# Patient Record
Sex: Male | Born: 1990 | Race: White | Hispanic: No | Marital: Married | Smoking: Former smoker
Health system: Southern US, Community
[De-identification: ages and names within clinical notes are randomized; demographics above are authoritative.]

## PROBLEM LIST (undated history)

## (undated) DIAGNOSIS — T23209A Burn of second degree of unspecified hand, unspecified site, initial encounter: Secondary | ICD-10-CM

## (undated) DIAGNOSIS — G709 Myoneural disorder, unspecified: Secondary | ICD-10-CM

## (undated) DIAGNOSIS — Z8669 Personal history of other diseases of the nervous system and sense organs: Secondary | ICD-10-CM

## (undated) DIAGNOSIS — J45909 Unspecified asthma, uncomplicated: Secondary | ICD-10-CM

## (undated) DIAGNOSIS — R002 Palpitations: Secondary | ICD-10-CM

## (undated) DIAGNOSIS — K219 Gastro-esophageal reflux disease without esophagitis: Secondary | ICD-10-CM

## (undated) DIAGNOSIS — R5383 Other fatigue: Secondary | ICD-10-CM

## (undated) HISTORY — DX: Gastro-esophageal reflux disease without esophagitis: K21.9

## (undated) HISTORY — DX: Palpitations: R00.2

## (undated) HISTORY — DX: Myoneural disorder, unspecified: G70.9

## (undated) HISTORY — DX: Personal history of other diseases of the nervous system and sense organs: Z86.69

## (undated) HISTORY — DX: Other fatigue: R53.83

## (undated) HISTORY — DX: Burn of second degree of unspecified hand, unspecified site, initial encounter: T23.209A

## (undated) HISTORY — DX: Unspecified asthma, uncomplicated: J45.909

---
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
## 2010-08-29 DIAGNOSIS — R5383 Other fatigue: Secondary | ICD-10-CM

## 2010-08-29 DIAGNOSIS — T23209A Burn of second degree of unspecified hand, unspecified site, initial encounter: Secondary | ICD-10-CM

## 2010-08-29 HISTORY — DX: Burn of second degree of unspecified hand, unspecified site, initial encounter: T23.209A

## 2010-08-29 HISTORY — DX: Other fatigue: R53.83

## 2010-09-12 ENCOUNTER — Emergency Department (HOSPITAL_COMMUNITY)
Admission: EM | Admit: 2010-09-12 | Discharge: 2010-09-12 | Payer: Self-pay | Source: Home / Self Care | Admitting: Family Medicine

## 2010-10-18 ENCOUNTER — Encounter: Payer: Self-pay | Admitting: Family Medicine

## 2010-10-18 ENCOUNTER — Other Ambulatory Visit: Payer: Self-pay | Admitting: Family Medicine

## 2010-10-18 ENCOUNTER — Ambulatory Visit (INDEPENDENT_AMBULATORY_CARE_PROVIDER_SITE_OTHER): Payer: 59 | Admitting: Family Medicine

## 2010-10-18 DIAGNOSIS — Z0000 Encounter for general adult medical examination without abnormal findings: Secondary | ICD-10-CM

## 2010-10-18 LAB — LIPID PANEL
Cholesterol: 152 mg/dL (ref 0–200)
HDL: 53.3 mg/dL (ref >39.00)
LDL Cholesterol: 85 mg/dL (ref 0–99)
Triglycerides: 70 mg/dL (ref 0.0–149.0)
VLDL: 14 mg/dL (ref 0.0–40.0)

## 2010-10-18 LAB — BASIC METABOLIC PANEL
CO2: 29 mEq/L (ref 19–32)
Calcium: 9.1 mg/dL (ref 8.4–10.5)
Chloride: 105 mEq/L (ref 96–112)
Creatinine, Ser: 1.1 mg/dL (ref 0.4–1.5)
GFR: 89.69 mL/min (ref >60.00)
Glucose, Bld: 90 mg/dL (ref 70–99)
Potassium: 4.5 mEq/L (ref 3.5–5.1)
Sodium: 139 mEq/L (ref 135–145)
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
## 2010-12-15 ENCOUNTER — Encounter: Payer: Self-pay | Admitting: Family Medicine

## 2010-12-17 ENCOUNTER — Ambulatory Visit (INDEPENDENT_AMBULATORY_CARE_PROVIDER_SITE_OTHER): Payer: 59 | Admitting: Family Medicine

## 2010-12-17 ENCOUNTER — Encounter: Payer: Self-pay | Admitting: Family Medicine

## 2010-12-17 VITALS — BP 130/90 | HR 80 | Temp 98.4°F | Ht 66.0 in | Wt 156.1 lb

## 2010-12-17 DIAGNOSIS — R5383 Other fatigue: Secondary | ICD-10-CM | POA: Insufficient documentation

## 2010-12-17 DIAGNOSIS — J029 Acute pharyngitis, unspecified: Secondary | ICD-10-CM

## 2010-12-17 LAB — MONONUCLEOSIS SCREEN: Mono Screen: NEGATIVE

## 2010-12-17 NOTE — Patient Instructions (Signed)
This could be mono given sore throat and fatigue and muscle aches or a viral infection with some residual fatigue. Push fluids and plenty of rest. Out of contact sports for next few weeks, until start feeling better. Call us with questions. Return if not improving as expected or worsening.

## 2010-12-17 NOTE — Progress Notes (Signed)
  Subjective:    Patient ID: Derek Thornton, male    DOB: 1990-11-08, 20 y.o.   MRN: [id number]  HPI CC: fatigue  2wk h/o progressive fatigue.  ST x 2 days last week.  + cough, mild.  + myalgias.  Feeling raw in groin crease but no rash (after drill jiu jitsu).  No fevers/chills, HA, sinus pain, abd pain, n/v/d, chest pain, SOB.  No new rashes.  Brother feels sick as well.  Lives in [place].  No new medicines, supplements, vitamins, herbs.  No new sexual contacts.   Did have similar episode 08/2010 of ST, LAD.  RST neg at [facility], told viral.  Review of Systems Per HPI    Objective:   Physical Exam  Nursing note and vitals reviewed. Constitutional: He is oriented to person, place, and time. He appears well-developed and well-nourished. No distress.  HENT:  Head: Normocephalic and atraumatic.  Right Ear: External ear normal.  Left Ear: External ear normal.  Nose: Nose normal.  Mouth/Throat: Uvula is midline and mucous membranes are normal. Posterior oropharyngeal erythema (+ PND) present. No oropharyngeal exudate, posterior oropharyngeal edema or tonsillar abscesses.  Eyes: Conjunctivae and EOM are normal. Pupils are equal, round, and reactive to light. No scleral icterus.  Neck: Normal range of motion. Neck supple. No thyromegaly present.  Cardiovascular: Normal rate, regular rhythm, normal heart sounds and intact distal pulses.   No murmur heard. Pulmonary/Chest: Effort normal and breath sounds normal. No respiratory distress. He has no wheezes. He has no rales.  Abdominal: Soft. Bowel sounds are normal. He exhibits no distension and no mass. There is splenomegaly (mild). There is no tenderness. There is no guarding.  Musculoskeletal: Normal range of motion.  Lymphadenopathy:    He has cervical adenopathy (mild nontender RAC LAD).  Neurological: He is alert and oriented to person, place, and time.  Skin: Skin is warm and dry. No rash (no rash in groin crease) noted.    Psychiatric: He has a normal mood and affect.          Assessment & Plan:

## 2010-12-17 NOTE — Assessment & Plan Note (Signed)
In setting of recent ST, myalgias.  Rapid strep neg.  ? Mono given age.  Hasn't had in past. Check monospot. If neg, ?viral illness.  Supportive care and f/u if not improving. Mild splenomegaly on exam.  Discussed out of contact sports for next 2-3 wks and until feeling better.

## 2011-02-20 ENCOUNTER — Inpatient Hospital Stay (INDEPENDENT_AMBULATORY_CARE_PROVIDER_SITE_OTHER)
Admission: RE | Admit: 2011-02-20 | Discharge: 2011-02-20 | Disposition: A | Discharge status: Home / Self Care | Payer: 59 | Source: Ambulatory Visit | Attending: Family Medicine | Admitting: Family Medicine

## 2011-02-20 DIAGNOSIS — T23039A Burn of unspecified degree of unspecified multiple fingers (nail), not including thumb, initial encounter: Secondary | ICD-10-CM

## 2011-02-22 ENCOUNTER — Inpatient Hospital Stay (INDEPENDENT_AMBULATORY_CARE_PROVIDER_SITE_OTHER)
Admission: RE | Admit: 2011-02-22 | Discharge: 2011-02-22 | Disposition: A | Discharge status: Home / Self Care | Payer: 59 | Source: Ambulatory Visit | Attending: Emergency Medicine | Admitting: Emergency Medicine

## 2011-02-22 DIAGNOSIS — T23069A Burn of unspecified degree of back of unspecified hand, initial encounter: Secondary | ICD-10-CM

## 2011-02-25 ENCOUNTER — Encounter: Payer: Self-pay | Admitting: Family Medicine

## 2011-02-25 ENCOUNTER — Ambulatory Visit (INDEPENDENT_AMBULATORY_CARE_PROVIDER_SITE_OTHER): Payer: 59 | Admitting: Family Medicine

## 2011-02-25 VITALS — BP 110/72 | HR 76 | Temp 99.3°F | Ht 66.0 in | Wt 165.8 lb

## 2011-02-25 DIAGNOSIS — T23209A Burn of second degree of unspecified hand, unspecified site, initial encounter: Secondary | ICD-10-CM

## 2011-02-25 NOTE — Patient Instructions (Signed)
Keep hand dry and clean. Return Monday at 8:15am for recheck and likely redressing (ok to double book). Watch out for fevers, increasing pain.
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
## 2011-02-26 ENCOUNTER — Encounter: Payer: Self-pay | Admitting: Family Medicine

## 2011-02-28 ENCOUNTER — Encounter: Payer: Self-pay | Admitting: Family Medicine

## 2011-02-28 ENCOUNTER — Ambulatory Visit (INDEPENDENT_AMBULATORY_CARE_PROVIDER_SITE_OTHER): Payer: 59 | Admitting: Family Medicine

## 2011-02-28 VITALS — BP 118/80 | HR 68 | Temp 98.1°F | Wt 166.1 lb

## 2011-02-28 DIAGNOSIS — T23209A Burn of second degree of unspecified hand, unspecified site, initial encounter: Secondary | ICD-10-CM

## 2011-02-28 NOTE — Patient Instructions (Signed)
Keep hand dry and clean. Return Friday morning for recheck and redressing. No charge. Watch out for fevers, increasing pain.  If that happens, let us know.

## 2011-02-28 NOTE — Assessment & Plan Note (Signed)
No evidence of super infection. Cleaned, irrigated, removed old silvadene.   Redressed - placed silvadene, then nonadherent gauze, then regular gauze and wrapped first 3 fingers with kerlex. DOI: 02/20/2011.  Blister slowly resorbing. RTC Friday for redress.
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
## 2011-03-01 ENCOUNTER — Encounter: Payer: Self-pay | Admitting: Family Medicine

## 2011-03-01 ENCOUNTER — Ambulatory Visit (INDEPENDENT_AMBULATORY_CARE_PROVIDER_SITE_OTHER): Payer: 59 | Admitting: Family Medicine

## 2011-03-01 VITALS — BP 124/76 | HR 68 | Temp 98.1°F

## 2011-03-01 DIAGNOSIS — T23209A Burn of second degree of unspecified hand, unspecified site, initial encounter: Secondary | ICD-10-CM

## 2011-03-01 NOTE — Assessment & Plan Note (Signed)
With ruptured blister. Removed blister, irrigated, dressed with SSD. Return Thursday for redressing.  Likely will need daily dressing changes. If any scarring or eschar, referral to wound care. DOI: 02/20/2011
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
## 2011-03-01 NOTE — Patient Instructions (Signed)
Keep dressing clean and dry. Return Thursday for follow up.

## 2011-03-03 ENCOUNTER — Encounter: Payer: Self-pay | Admitting: Family Medicine

## 2011-03-03 ENCOUNTER — Ambulatory Visit (INDEPENDENT_AMBULATORY_CARE_PROVIDER_SITE_OTHER): Payer: 59 | Admitting: Family Medicine

## 2011-03-03 VITALS — BP 138/80 | HR 72 | Temp 98.3°F

## 2011-03-03 DIAGNOSIS — T23209A Burn of second degree of unspecified hand, unspecified site, initial encounter: Secondary | ICD-10-CM

## 2011-03-03 NOTE — Patient Instructions (Signed)
Return tomorrow for f/u and re dressing.

## 2011-03-03 NOTE — Assessment & Plan Note (Signed)
Some granulation tissue. No evidence of infection. Redressed today. Return tomorrow for f/u, will discuss dressing change then at home. Update if questions.
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
## 2011-03-04 ENCOUNTER — Ambulatory Visit: Payer: 59 | Admitting: Family Medicine

## 2011-03-04 ENCOUNTER — Ambulatory Visit (INDEPENDENT_AMBULATORY_CARE_PROVIDER_SITE_OTHER): Payer: 59 | Admitting: Family Medicine

## 2011-03-04 ENCOUNTER — Encounter: Payer: Self-pay | Admitting: Family Medicine

## 2011-03-04 DIAGNOSIS — T23209A Burn of second degree of unspecified hand, unspecified site, initial encounter: Secondary | ICD-10-CM

## 2011-03-04 MED ORDER — SILVER SULFADIAZINE 1 % EX CREA: TOPICAL_CREAM | Freq: Every day | CUTANEOUS | Status: DC
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
## 2011-03-04 NOTE — Assessment & Plan Note (Addendum)
Increase in granulation tissue. No evidence of infection. Redressed today. Discussed case with Dr. Para March, he saw wound. Discussed how to dress over weekend, return on Tuesday for f/u with Dr D as I will be gone that week. Sent in SSD script to pharmacy. Update if questions.

## 2011-03-04 NOTE — Patient Instructions (Signed)
Good to see you today. Silver sulfadene cream sent to pharmacy. Start changing dressing once daily at home. Remove old dressing, then wash with normal saline solution or sterile water, then place white cream over burn, then cover with non-adherent gauze, then regular gauze, then kerlex to wrap hand.  Wrap fingers individually. You can get materials at medical supply store, possibly at local pharmacy. Return on Tuesday with Dr. Para March for recheck and dressing change.

## 2011-03-08 ENCOUNTER — Ambulatory Visit (INDEPENDENT_AMBULATORY_CARE_PROVIDER_SITE_OTHER): Payer: 59 | Admitting: Family Medicine

## 2011-03-08 ENCOUNTER — Encounter: Payer: Self-pay | Admitting: Family Medicine

## 2011-03-08 DIAGNOSIS — R5383 Other fatigue: Secondary | ICD-10-CM

## 2011-03-08 DIAGNOSIS — R5381 Other malaise: Secondary | ICD-10-CM

## 2011-03-08 DIAGNOSIS — T23209A Burn of second degree of unspecified hand, unspecified site, initial encounter: Secondary | ICD-10-CM

## 2011-03-08 NOTE — Patient Instructions (Signed)
I want to see your hand again on Friday.  Keep going with the dressing changes.  Take care.   No charge on visit.

## 2011-03-08 NOTE — Assessment & Plan Note (Signed)
Continue topical tx with gentle rom exercise at the time of dressing change.  Recheck Friday.

## 2011-03-08 NOTE — Progress Notes (Signed)
F/u for burn.  Feeling well except for fatigue and occ ST- he had talked to Dr. Reece Agar about this prev.  He had been compliant with dressing changes.    Meds, vitals, and allergies reviewed.   ROS: See HPI.  Otherwise, noncontributory.  nad OP with mild cobblestoning but no exudates L hand with resolving burn.  The area is pink with granulation tissue but no sign of infection/pus.  Slight dec in rom for flexion of the L 2nd digit.  Distally nv intact

## 2011-03-08 NOTE — Assessment & Plan Note (Signed)
Nontoxic, I will defer to PMD.

## 2011-03-11 ENCOUNTER — Encounter: Payer: Self-pay | Admitting: Family Medicine

## 2011-03-11 ENCOUNTER — Ambulatory Visit (INDEPENDENT_AMBULATORY_CARE_PROVIDER_SITE_OTHER): Payer: 59 | Admitting: Family Medicine

## 2011-03-11 DIAGNOSIS — T23209A Burn of second degree of unspecified hand, unspecified site, initial encounter: Secondary | ICD-10-CM

## 2011-03-11 NOTE — Assessment & Plan Note (Signed)
Healing, continue bandage as prev and f/u with PMD next week, call in sooner/eval sooner prn.  He agrees.

## 2011-03-11 NOTE — Progress Notes (Signed)
ROM improved and feeling well.  Has had hand bandaged w/o complication.  Meds, vitals, and allergies reviewed.   ROS: See HPI.  Otherwise, noncontributory.  nad L hand with pink healthy appearing tissue.  Distally NV intact

## 2011-03-11 NOTE — Patient Instructions (Signed)
Keep your hand bandaged over the weekend and see Dr. Reece Agar next week (Wednesday).  Take care.

## 2011-03-14 ENCOUNTER — Ambulatory Visit: Payer: 59 | Admitting: Family Medicine

## 2011-03-15 ENCOUNTER — Ambulatory Visit: Payer: 59 | Admitting: Family Medicine

## 2011-03-16 ENCOUNTER — Ambulatory Visit (INDEPENDENT_AMBULATORY_CARE_PROVIDER_SITE_OTHER): Payer: 59 | Admitting: Family Medicine

## 2011-03-16 ENCOUNTER — Encounter: Payer: Self-pay | Admitting: Family Medicine

## 2011-03-16 VITALS — BP 116/70 | HR 72 | Temp 98.3°F

## 2011-03-16 DIAGNOSIS — T23209A Burn of second degree of unspecified hand, unspecified site, initial encounter: Secondary | ICD-10-CM

## 2011-03-16 NOTE — Progress Notes (Signed)
  Subjective:    Patient ID: Derek Thornton, male    DOB: Nov 26, 1990, 20 y.o.   MRN: [id number]  HPI CC: f/u partial thickness burn.  Doing well.  No fevers/chills, no draining or redness or pus.  No pain.  Dressing daily with silver sulfadiazene, father helps at times.  Notes improvement in range of motion.  Review of Systems Per HPI    Objective:   Physical Exam  Nursing note and vitals reviewed. NAD L hand with pink, healthy tissue.  NV intact. Some dead skin distal indexfinger past PIP, removed with forceps. Soaked hand in normal saline.        Assessment & Plan:
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
## 2011-03-16 NOTE — Patient Instructions (Signed)
Hand's looking good. Continue dressing changes as up to now. Return if fever, spreading redness or any concerns. Do some hand grips with ball each day during dressing changes to help with range of motion.

## 2011-03-24 ENCOUNTER — Ambulatory Visit (INDEPENDENT_AMBULATORY_CARE_PROVIDER_SITE_OTHER): Payer: 59 | Admitting: Family Medicine

## 2011-03-24 ENCOUNTER — Encounter: Payer: Self-pay | Admitting: Family Medicine

## 2011-03-24 VITALS — BP 122/78 | HR 80 | Temp 98.6°F | Wt 165.0 lb

## 2011-03-24 DIAGNOSIS — J029 Acute pharyngitis, unspecified: Secondary | ICD-10-CM

## 2011-03-24 DIAGNOSIS — W57XXXA Bitten or stung by nonvenomous insect and other nonvenomous arthropods, initial encounter: Secondary | ICD-10-CM

## 2011-03-24 DIAGNOSIS — R5383 Other fatigue: Secondary | ICD-10-CM

## 2011-03-24 DIAGNOSIS — T23209A Burn of second degree of unspecified hand, unspecified site, initial encounter: Secondary | ICD-10-CM

## 2011-03-24 DIAGNOSIS — T148XXA Other injury of unspecified body region, initial encounter: Secondary | ICD-10-CM

## 2011-03-24 DIAGNOSIS — T148 Other injury of unspecified body region: Secondary | ICD-10-CM

## 2011-03-24 LAB — COMPREHENSIVE METABOLIC PANEL
ALT: 32 U/L (ref 0–53)
AST: 25 U/L (ref 0–37)
Albumin: 4.7 g/dL (ref 3.5–5.2)
Alkaline Phosphatase: 76 U/L (ref 39–117)
BUN: 17 mg/dL (ref 6–23)
CO2: 31 mEq/L (ref 19–32)
Calcium: 9.4 mg/dL (ref 8.4–10.5)
Chloride: 101 mEq/L (ref 96–112)
GFR: 91.24 mL/min (ref >60.00)
Glucose, Bld: 100 mg/dL — ABNORMAL HIGH (ref 70–99)
Potassium: 3.8 mEq/L (ref 3.5–5.1)
Sodium: 139 mEq/L (ref 135–145)
Total Bilirubin: 0.7 mg/dL (ref 0.3–1.2)
Total Protein: 7.7 g/dL (ref 6.0–8.3)

## 2011-03-24 LAB — CBC WITH DIFFERENTIAL/PLATELET
Basophils Absolute: 0 10*3/uL (ref 0.0–0.1)
Basophils Relative: 0.3 % (ref 0.0–3.0)
Eosinophils Absolute: 0.1 10*3/uL (ref 0.0–0.7)
Hemoglobin: 14.8 g/dL (ref 13.0–17.0)
Lymphocytes Relative: 29.2 % (ref 12.0–46.0)
MCHC: 34.4 g/dL (ref 30.0–36.0)
Monocytes Relative: 6 % (ref 3.0–12.0)
Neutro Abs: 3.2 10*3/uL (ref 1.4–7.7)
Neutrophils Relative %: 62.3 % (ref 43.0–77.0)
RBC: 4.8 Mil/uL (ref 4.22–5.81)

## 2011-03-24 LAB — TSH: TSH: 2.28 u[IU]/mL (ref 0.35–5.50)

## 2011-03-24 NOTE — Assessment & Plan Note (Signed)
rec switch from silvadene to vaseline. Continues healing well, however somewhat dry skin. Continue daily dressing changes.

## 2011-03-24 NOTE — Assessment & Plan Note (Addendum)
Unclear etiology, going on for 6 mo now. Check for reversible causes with blood work. Denies illicit substances, not high risk sexual illness, check hep panel. Consider checking testosterone, although denies depressed mood. Discussed healthy eating as well as sleep habits, seem healthy. Check lyme titers as recent tick bite.

## 2011-03-24 NOTE — Assessment & Plan Note (Signed)
1/4 centor criteria for strep, did not check RST. No LAD on exam today. ? Return of viral illness.

## 2011-03-24 NOTE — Patient Instructions (Addendum)
May stop silvadene, use vaseline ointment with dressing changes. Will check blood work today.   Continue to push water, continue multivitamin. We will notify you with results

## 2011-03-24 NOTE — Progress Notes (Signed)
  Subjective:    Patient ID: Derek Thornton, male    DOB: [birth year], 20 y.o.   MRN: [id number]  HPI CC: fatigue, lump in throat  Presents with mom.  Wt Readings from Last 3 Encounters:  03/24/11 165 lb (74.844 kg)  02/28/11 166 lb 1.9 oz (75.352 kg)  02/25/11 165 lb 12 oz (75.184 kg)  weight gain from 155 to 165lbs in 6 mo.  Fatigue continued.  Now feeling like having fog in brain, spaciness for last several weeks. Mom states he has been complaining of these sxs more for the last month.  Sometimes feels lightheaded when working on computer.  Has been unable to work out like he previously could 2/2 fatigue.  Trouble focusing.  Denies depressed mood, no anhedonia.  This week - noticed swelling in neck on left side.  Noticing sore throat as well, worse with talking for prolonged periods of time.    No fevers, chills, abd pain, n/v, diarrhea, constipation.  Sleeping well at night, averages 8 hours, light sleeper, wakes up easily 3-4 x throughout night.  No snoring, no PNDyspnea.  No cough, abd pain, jaundice, no international exposure.  No night sweats.  Denies smoking, no drinking, no rec drugs.  Caffeine - 3 soda drinks/day, no coffee or tea.  Staying well hydrated.  Eating ok.  Was bitten by tick 4 mo ago, never fever or rash.  Mother is Hep C carrier.  States had STD check late 2011 with negative CT/GC, HIV, RPR, no new partners.  Review of Systems Per HPI    Objective:   Physical Exam  Nursing note and vitals reviewed. Constitutional: He appears well-developed and well-nourished. No distress.  HENT:  Head: Normocephalic and atraumatic.  Right Ear: Hearing, tympanic membrane, external ear and ear canal normal.  Left Ear: Hearing, tympanic membrane, external ear and ear canal normal.  Nose: Nose normal. No mucosal edema or rhinorrhea.  Mouth/Throat: Uvula is midline, oropharynx is clear and moist and mucous membranes are normal. No oropharyngeal exudate, posterior oropharyngeal  edema or posterior oropharyngeal erythema.  Eyes: Conjunctivae and EOM are normal. Pupils are equal, round, and reactive to light. No scleral icterus.  Neck: Normal range of motion. Neck supple. No thyromegaly present.  Cardiovascular: Normal rate, regular rhythm, normal heart sounds and intact distal pulses.   No murmur heard. Pulmonary/Chest: Effort normal and breath sounds normal. No respiratory distress. He has no wheezes. He has no rales.  Abdominal: Soft. Bowel sounds are normal. He exhibits no distension. There is no tenderness. There is no rebound.  Musculoskeletal: Normal range of motion. He exhibits no edema.  Lymphadenopathy:       Head (right side): No submental, no submandibular, no tonsillar, no preauricular, no posterior auricular and no occipital adenopathy present.       Head (left side): No submental, no submandibular, no tonsillar, no preauricular, no posterior auricular and no occipital adenopathy present.    He has no cervical adenopathy.    He has no axillary adenopathy.       Right: No supraclavicular adenopathy present.       Left: No supraclavicular adenopathy present.  Skin: Skin is warm and dry. No rash noted.  Psychiatric: He has a normal mood and affect.          Assessment & Plan:

## 2011-03-25 LAB — HEPATITIS PANEL, ACUTE
Hep A IgM: NEGATIVE
Hep B C IgM: NEGATIVE
Hepatitis B Surface Ag: NEGATIVE

## 2011-03-25 LAB — B. BURGDORFI ANTIBODIES: B burgdorferi Ab IgG+IgM: 0.16 {ISR}

## 2011-03-28 ENCOUNTER — Telehealth: Payer: Self-pay | Admitting: Family Medicine

## 2011-03-28 DIAGNOSIS — R5383 Other fatigue: Secondary | ICD-10-CM

## 2011-03-28 NOTE — Telephone Encounter (Signed)
Please notify all blood work returned normal. Hepatitis panel (A B and C), lyme disease titers negative, kidneys, liver, sugar, electrolytes, blood count, thyroid, vitamin B12,   Nothing to explain fatigue. Questions for him:  1 any memory loss?  2 any family history of low testosterone?  If so, may recommend checking testosterone level.   Otherwise recommend slowly increasing activity as tolerated - starting with exercise as that will release endorphins and hopefully pick up his energy level.

## 2011-03-28 NOTE — Telephone Encounter (Signed)
Spoke with patient. He said he doesn't really have memory loss but says his brain feels "foggy" like he has to think about things really hard to remember them.   He says he thinks one of his cousins has low testosterone but none of his immediate family members.   He is aware you are out of the office, so I told him I would give him a call back with your thoughts when I hear back from you.

## 2011-03-28 NOTE — Telephone Encounter (Signed)
Message left for patient to return my call.  

## 2011-03-30 NOTE — Telephone Encounter (Signed)
Message left notifying patient to schedule lab appt for testosterone.

## 2011-03-30 NOTE — Telephone Encounter (Signed)
Could check testosterone level.  Have him come back for this test at his convenience in am. If normal, recommend slowly increasing activity as tolerated to increase energy level.

## 2011-04-04 ENCOUNTER — Other Ambulatory Visit (INDEPENDENT_AMBULATORY_CARE_PROVIDER_SITE_OTHER): Payer: 59 | Admitting: Family Medicine

## 2011-04-04 DIAGNOSIS — R5383 Other fatigue: Secondary | ICD-10-CM

## 2011-04-18 ENCOUNTER — Telehealth: Payer: Self-pay | Admitting: *Deleted

## 2011-04-18 DIAGNOSIS — R5383 Other fatigue: Secondary | ICD-10-CM

## 2011-04-18 NOTE — Telephone Encounter (Signed)
Pt's mother calls requesting a referral to [facility] Neuro re: pt's "confusion, problems focusing, fogginess" she states  this has been going on for several months and it has been discussed with you. I advised mother we may need to see him again, and that neuro referrals can take a while and often require a lot of documentation. She asked I run it by you before making an appt.

## 2011-04-19 NOTE — Telephone Encounter (Signed)
Ok to refer to neurology. Longstanding fatigue, now with mental fogginess for further eval. Placed order in chart.

## 2011-04-20 NOTE — Telephone Encounter (Signed)
Spoke with patient. He does want the neuro referral. I advised him that Shirlee Limerick would be calling him to get something scheduled. He verbalized understanding.

## 2011-04-20 NOTE — Telephone Encounter (Addendum)
Please check with son to see if he still wants this done and then notify we will be contacting him for referral. Would likely recommend Dr. Sandria Manly if able to get in with him.

## 2011-04-21 NOTE — Telephone Encounter (Signed)
Neurolgy appt made with Dr Denton Meek on 05/06/2011. MK

## 2011-04-28 ENCOUNTER — Encounter: Payer: Self-pay | Admitting: Family Medicine

## 2011-04-28 ENCOUNTER — Ambulatory Visit (INDEPENDENT_AMBULATORY_CARE_PROVIDER_SITE_OTHER): Payer: 59 | Admitting: Family Medicine

## 2011-04-28 VITALS — BP 122/72 | HR 84 | Temp 98.2°F | Wt 169.8 lb

## 2011-04-28 DIAGNOSIS — R5381 Other malaise: Secondary | ICD-10-CM

## 2011-04-28 DIAGNOSIS — W57XXXA Bitten or stung by nonvenomous insect and other nonvenomous arthropods, initial encounter: Secondary | ICD-10-CM | POA: Insufficient documentation

## 2011-04-28 DIAGNOSIS — R5383 Other fatigue: Secondary | ICD-10-CM

## 2011-04-28 DIAGNOSIS — T23209A Burn of second degree of unspecified hand, unspecified site, initial encounter: Secondary | ICD-10-CM

## 2011-04-28 MED ORDER — DOXYCYCLINE HYCLATE 100 MG PO CAPS: 100.0000 mg | ORAL_CAPSULE | Freq: Two times a day (BID) | ORAL | Status: DC

## 2011-04-28 NOTE — Assessment & Plan Note (Signed)
Has healed remarkably well, ROM hand intact.

## 2011-04-28 NOTE — Assessment & Plan Note (Addendum)
Physical fatigue improved. "Mental fatigue" remains.  Not consistent with ADD, denies mood disorder, drug use. Has had blood work eval that returned negative including normal testosterone, neg lyme titers, normal TSH, B12. Will appreciate neuro opinion.  Has appt with Dr. Modesto Charon next week.

## 2011-04-28 NOTE — Patient Instructions (Addendum)
I will give you doxycycline to take upon first sign in next 2 wks of fever >101, new rash, abd pain, nausea, HA, muscle pain or joint pains. Good to see you today, call us with questions.  Deer Tick Bites Deer ticks are brown arachnids (spider family) that vary in size from as small as the head of a pin to 1/4 inch (1/2 cm) diameter. They thrive in wooded areas. Deer are the preferred host of adult deer ticks. Small rodents are the host of young ticks (nymphs). When a person walks in a field or wooded area, young and adult ticks in the surrounding grass and vegetation can attach themselves to the skin. They can suck blood for hours to days if unnoticed. Ticks are found all over the [place] Some ticks carry a specific bacteria (Borrelia burgdorferi) that causes an infection called Lyme disease. The bacteria is typically passed into a person during the blood sucking process. This happens after the tick has been attached for at least a number of hours. While ticks can be found all over the [place] those carrying the bacteria that causes Lyme disease are most common in [place] and the [place]. Only a small proportion of ticks in these areas carry the Lyme disease bacteria and cause human infections. Ticks usually attach to warm spots on the body, such as the:  Head.   Back.   Neck.   Armpits.   Groin.  SYMPTOMS Most of the time, a deer tick bite will not be felt. You may or may not see the attached tick. You may notice mild irritation or redness around the bite site. If the deer tick passes the Lyme disease bacteria to a person, a round, red rash may be noticed 2 to 3 days after the bite. The rash may be clear in the middle, like a bull's-eye or target. If not treated, other symptoms may develop several days to weeks after the onset of the rash. These symptoms may include:  New rash lesions.   Fatigue and weakness.   General ill feeling and achiness.   Chills.   Headache and neck pain.    Swollen lymph glands.   Sore muscles and joints.  5 to 15% of untreated people with Lyme disease may develop more severe illnesses after several weeks to months. This may include inflammation of the brain lining (meningitis), nerve palsies, an abnormal heartbeat, or severe muscle and joint pain and inflammation (myositis or arthritis). DIAGNOSIS  Physical exam and medical history.   Viewing the tick if it was saved for confirmation.   Blood tests (to check or confirm the presence of Lyme disease).  TREATMENT Most ticks do not carry disease. If found, an attached tick should be removed using tweezers. Tweezers should be placed under the body of the tick so it is removed by its attachment parts (pincers). If there are signs or symptoms of being sick, or Lyme disease is confirmed, medicines (antibiotics) that kill germs are usually prescribed. In more severe cases, antibiotics may be given through an intravenous (IV) access. HOME CARE INSTRUCTIONS  Always remove ticks with tweezers. Do not use petroleum jelly or other methods to kill or remove the tick. Slide the tweezers under the body and pull out as much as you can. If you are not sure what it is, save it in a jar and show your caregiver.   Once you remove the tick, the skin will heal on its own. Wash your hands and the affected area  with water and soap. You may place a bandage on the affected area.   Take medicine as directed. You may be advised to take a full course of antibiotics.   Follow up with your caregiver as recommended.  FINDING OUT THE RESULTS OF YOUR TEST Not all test results are available during your visit. If your test results are not back during the visit, make an appointment with your caregiver to find out the results. Do not assume everything is normal if you have not heard from your caregiver or the medical facility. It is important for you to follow up on all of your test results. PROGNOSIS If Lyme disease is  confirmed, early treatment with antibiotics is very effective. Following preventive guidelines is important since it is possible to get the disease more than once. PREVENTION  Wear long sleeves and long pants in wooded or grassy areas. Tuck your pants into your socks.   Use an insect repellent while hiking.   Check yourself, your children, and your pets regularly for ticks after playing outside.   Clear piles of leaves or brush from your yard. Ticks might live there.  SEEK MEDICAL CARE IF:  You or your child has an oral temperature above 101.   You develop a severe headache following the bite.   You feel generally ill.   You notice a rash.   You are having trouble removing the tick.   The bite area has red skin or yellow drainage.  SEEK IMMEDIATE MEDICAL CARE IF:  Your face is weak and droopy or you have other neurological symptoms.   You have severe joint pain or weakness.  MAKE SURE YOU:  Understand these instructions.   Will watch your condition.   Will get help right away if you are not doing well or get worse.  FOR MORE INFORMATION: Centers for Disease Control and Prevention: FootballExhibition.com.br American Academy of [employer]icians: www.https://powers.com/ Document Released: 11/09/2009  [facility] Patient Information 2011 [place], [place].

## 2011-04-28 NOTE — Progress Notes (Signed)
  Subjective:    Patient ID: Derek Thornton, male    DOB: December 11, 1990, 20 y.o.   MRN: [id number]  HPI CC: mult tick bites.  In woods over weekend.  Covered R foot/leg in deer ticks, thinks walked through nest on Saturday (5 d ago).  Tried to remove all he could, showered when got home, but has continued to find on skin up to yesterday removed one that was attached.  Now has rash right foot - itchy, but thinks this is where they bit him.  Has been using calomine lotion.  Possibly nymph.  No fevers, abd pain, muscle aches.  States grandfather died of RMSF.  Fatigue - did start exercising more - running w dog.  physical fatigue significantly improved, no longer with muscle aches.    Mental fatigue - remains.  Going on 8 mo now.  Described as easily forgetting things, loses track of time, "fogginess" described as feeling like he's in a dream, happens more at work.  Sometimes lightheaded when working on computer.  Has had 4 concussions in past, none recent.  Occasionally gets headache with this, but not constant.  HA described as pressure behind eyes and occipital region.  No nausea/vomiting, photo/phonophobia.  Doesn't think ADD, no h/o such when in school.  Denies mood issues, no anhedonia.  Denies drug ingestion, supplements.  Not high risk sexual activity.  Pt desires neurology referral to allay concerns.  Review of Systems Per HPI    Objective:   Physical Exam  Nursing note and vitals reviewed. Constitutional: He is oriented to person, place, and time. He appears well-developed and well-nourished. No distress.  HENT:  Head: Normocephalic and atraumatic.  Mouth/Throat: Oropharynx is clear and moist. No oropharyngeal exudate.  Eyes: Conjunctivae and EOM are normal. Pupils are equal, round, and reactive to light.  Neurological: He is alert and oriented to person, place, and time.  Skin: Skin is warm and dry.       Pinpoint papules right leg and foot from bug bites.  Somewhat pruritic.  1-2  papules bilateral wrists, pruritic. No other rash.  Psychiatric: He has a normal mood and affect. His behavior is normal. Judgment normal.          Assessment & Plan:

## 2011-04-28 NOTE — Assessment & Plan Note (Addendum)
Shown pictures of ticks, identifies black legged (deer) tick nymph as arthropod he was bitten by.  These can carry disease.  May be larvae given quantity. Rash on leg and wrists attributed to actual bug bites, not subsequent rash (too soon for RMSF).   Given amt of tick exposure provided with sxs to watch for in next 2 wks, if any will treat with doxy.  Pt agrees with plan, provided printed script for 10d course doxy.

## 2011-04-29 ENCOUNTER — Telehealth: Payer: Self-pay | Admitting: *Deleted

## 2011-04-29 NOTE — Telephone Encounter (Signed)
Patient's mother notified.

## 2011-04-29 NOTE — Telephone Encounter (Signed)
Mom called because she has questions about whether or not Derek Thornton should start on the antibiotic that Dr. Sharen Hones prescribed when he was in to see him.  She didn't know if he should start the medication now or hold off on starting it.  Please advise.

## 2011-04-29 NOTE — Telephone Encounter (Signed)
To refer to pt instructions and what I discussed with during visit. "take upon first sign in next 2 wks of fever >101, new rash, abd pain, nausea, HA, muscle pain or joint pains" So not yet, watch for signs of illness.

## 2011-05-06 ENCOUNTER — Encounter: Payer: Self-pay | Admitting: Neurology

## 2011-05-06 ENCOUNTER — Ambulatory Visit (INDEPENDENT_AMBULATORY_CARE_PROVIDER_SITE_OTHER): Payer: 59 | Admitting: Neurology

## 2011-05-06 VITALS — BP 116/78 | HR 84 | Ht 67.0 in | Wt 170.0 lb

## 2011-05-06 DIAGNOSIS — R5383 Other fatigue: Secondary | ICD-10-CM

## 2011-05-06 NOTE — Progress Notes (Signed)
Dear Dr. Sharen Hones,  Thank you for having me see Mr. Derek Thornton in consultation today for his problems with dizziness, memory problems, "brain fog" and fatigue. As you may recall he is a 20 year old man with a history of severe migraine headaches who presents with a two-month history of dizziness and memory problems. He decided describes the dizziness as more of a light headedness. It is not orthostatic in nature. It lasts less than 30 seconds. Always happening 2-3 times per week it is now happening less frequency. He does not describe vertiginous feelings. This is accompanied by headache. He also did describes memory problems. However these memory problems haven't affected his job. He continues to excel at work.    The only precipitating event he can describe is the onset of sudden fatigue that lasted about 2 months in March. This happened after an upper respiratory tract infection where he was tested for mononucleosis. He did not have mono but had 2 months of unrelenting fatigue and muscle pain. This has remitted but then replaced by the nonspecific feelings as listed above. All in all he feels that everything is improving.  He did have some tick bites but these were in August of this year.  Past medical history significant for severe migraines with nausea and vomiting as a child. His last one was at the age of 46. Migraines or not an active issue for him.  Derek Thornton a social history he works as Transport planner. He does not currently smoke but quit in January 2012. He'll he drinks alcohol on weekends.  He does have a history of 4 concussions. Unclear whether he lost consciousness this was certainly based after they occurred.  Family history: There is no other neurologic disease other than migraine headaches in his mother.  Review of systems: 13 systems were reviewed. There were significant for excessive fatigue and dizziness as mentioned above. He also has allover body weakness. Her problems with  inability to concentrate and disorientation. He denies any depressive symptoms.   Examination: Filed Vitals:   05/06/11 1407  BP: 116/78  Pulse: 84  Height: 5\' 7"  (1.702 m)  Weight: 170 lb (77.111 kg)   In general he is a very well-appearing gentleman in no acute distress. Cardiovascular: The patient has a regular rate and rhythm and no carotid bruits.  Fundoscopy:  Disks are flat. Vessel caliber within normal limits.  Mental status:   The patient is oriented to person, place and time. Recent and remote memory are intact. Attention span and concentration are normal. Language including repetition, naming, following commands are intact. Fund of knowledge of current and historical events, as well as vocabulary are normal.  MMSE 30/39.  CES-D 6/60.  Cranial Nerves: Pupils are equally round and reactive to light. Visual fields full to confrontation. Extraocular movements are intact without nystagmus. Facial sensation and muscles of mastication are intact. Muscles of facial expression are symmetric. Hearing intact to bilateral finger rub. Tongue protrusion, uvula, palate midline.  Shoulder shrug intact  Motor:  The patient has normal bulk and tone, no pronator drift and 5/5 strength bilaterally.  There are no adventitious movements.  Reflexes:  Are 2+ bilaterally in both the upper and lower extremities.    Coordination:  Normal finger to nose.  No dysdiadokinesia.  Sensation is intact to temperature and vibration. Graphesthesia normal.  Gait and Station are normal.  Tandem gait is intact.  Romberg is negative  FRS: - glabellar, - jawjerk, - palmomental, - snout  Impression:  Derek Thornton  is a 20 year old man with a history of severe migraine headaches and concussions who presents with a syndrome of fatigue after a possible upper respiratory tract viral infection followed by several months of difficulty with memory and concentration as well as dizziness. His exam today is normal. I have a  feeling that his syndrome is a benign phenomenon. I am particularly heartened by the fact that he seems to be getting better. Sometimes we can see these nonspecific symptoms in people with migraines even without active headaches. It is also possible this is the residual effects of a viral infections. As Thornton as he continues to improve, I don't think there is any point in getting further investigations. However if he does get worse I would get an MRI of his brain and EEG. However I think these are very unlikely to uncover anything sinister.  He can return to see [place] on an as needed basis.  Thank you for having [place] see this patient in consultation.  Feel free to contact me with any questions.  Lupita Raider Modesto Charon, MD [facility],  [address] [place], [place] [address] Phone: [phone] Fax: [phone].

## 2011-05-09 ENCOUNTER — Encounter: Payer: Self-pay | Admitting: Family Medicine

## 2011-06-14 ENCOUNTER — Other Ambulatory Visit: Payer: Self-pay

## 2011-06-14 ENCOUNTER — Telehealth: Payer: Self-pay | Admitting: Neurology

## 2011-06-14 DIAGNOSIS — R410 Disorientation, unspecified: Secondary | ICD-10-CM

## 2011-06-14 DIAGNOSIS — R51 Headache: Secondary | ICD-10-CM

## 2011-06-14 NOTE — Telephone Encounter (Signed)
Pt's mother called and stated, pt would like to go ahead with the MRI.  She states he is having pressure and HA's still and his confusion seems to be a little worse and still can't remember things.

## 2011-06-14 NOTE — Telephone Encounter (Signed)
Tiffany - if you could set him off for an MRI of his brain without contrast.  Misty Stanley - please have him see me in a couple of weeks.

## 2011-06-14 NOTE — Telephone Encounter (Signed)
Pt is still experiencing symptoms and would like to schedule the MRI discussed at his appointment.

## 2011-06-14 NOTE — Telephone Encounter (Signed)
Notified pt's mother of appt for MRI at [facility] on 10/18 at 8:00pm and f/u with Dr. Modesto Charon on 11/20 at 10:30am

## 2011-06-16 ENCOUNTER — Ambulatory Visit (HOSPITAL_COMMUNITY)
Admission: RE | Admit: 2011-06-16 | Discharge: 2011-06-16 | Disposition: A | Discharge status: Home / Self Care | Payer: 59 | Source: Ambulatory Visit | Attending: Neurology | Admitting: Neurology

## 2011-06-16 DIAGNOSIS — F29 Unspecified psychosis not due to a substance or known physiological condition: Secondary | ICD-10-CM | POA: Insufficient documentation

## 2011-06-16 DIAGNOSIS — R51 Headache: Secondary | ICD-10-CM | POA: Insufficient documentation

## 2011-06-16 DIAGNOSIS — R410 Disorientation, unspecified: Secondary | ICD-10-CM

## 2011-06-16 DIAGNOSIS — R5381 Other malaise: Secondary | ICD-10-CM | POA: Insufficient documentation

## 2011-06-18 ENCOUNTER — Encounter: Payer: Self-pay | Admitting: Neurology

## 2011-06-21 ENCOUNTER — Telehealth: Payer: Self-pay | Admitting: Neurology

## 2011-06-21 NOTE — Telephone Encounter (Signed)
Pt's mother called and said someone from this office left a voicemail that she couldn't understand. She thought maybe it was the results of Malick's MRI?

## 2011-06-22 NOTE — Telephone Encounter (Signed)
spoke to mom.  let her know the MRI was normal.

## 2011-07-19 ENCOUNTER — Ambulatory Visit: Payer: 59 | Admitting: Neurology

## 2012-08-16 ENCOUNTER — Encounter: Payer: Self-pay | Admitting: Family Medicine

## 2012-08-16 ENCOUNTER — Ambulatory Visit (INDEPENDENT_AMBULATORY_CARE_PROVIDER_SITE_OTHER): Payer: 59 | Admitting: Family Medicine

## 2012-08-16 VITALS — BP 110/80 | HR 80 | Temp 98.2°F | Wt 149.5 lb

## 2012-08-16 DIAGNOSIS — K219 Gastro-esophageal reflux disease without esophagitis: Secondary | ICD-10-CM | POA: Insufficient documentation

## 2012-08-16 MED ORDER — OMEPRAZOLE 20 MG PO CPDR: 20.0000 mg | DELAYED_RELEASE_CAPSULE | Freq: Every day | ORAL | Status: DC

## 2012-08-16 NOTE — Patient Instructions (Signed)
Head of bed elevated. Avoidance of citrus, fatty foods, chocolate, peppermint, and excessive alcohol, along with sodas, orange juice (acidic drinks) At least a few hours between dinner and bed, minimize naps after eating. No smoking.  Start omeprazole 20mg  daily for 3 weeks, then use zantac or pepcid as needed.  Let me know if this doesn't help. Gastroesophageal Reflux Disease, Adult Gastroesophageal reflux disease (GERD) happens when acid from your stomach flows up into the esophagus. When acid comes in contact with the esophagus, the acid causes soreness (inflammation) in the esophagus. Over time, GERD may create small holes (ulcers) in the lining of the esophagus. CAUSES   Increased body weight. This puts pressure on the stomach, making acid rise from the stomach into the esophagus.  Smoking. This increases acid production in the stomach.  Drinking alcohol. This causes decreased pressure in the lower esophageal sphincter (valve or ring of muscle between the esophagus and stomach), allowing acid from the stomach into the esophagus.  Late evening meals and a full stomach. This increases pressure and acid production in the stomach.  A malformed lower esophageal sphincter. Sometimes, no cause is found. SYMPTOMS   Burning pain in the lower part of the mid-chest behind the breastbone and in the mid-stomach area. This may occur twice a week or more often.  Trouble swallowing.  Sore throat.  Dry cough.  Asthma-like symptoms including chest tightness, shortness of breath, or wheezing. DIAGNOSIS  Your caregiver may be able to diagnose GERD based on your symptoms. In some cases, X-rays and other tests may be done to check for complications or to check the condition of your stomach and esophagus. TREATMENT  Your caregiver may recommend over-the-counter or prescription medicines to help decrease acid production. Ask your caregiver before starting or adding any new medicines.  HOME CARE  INSTRUCTIONS   Change the factors that you can control. Ask your caregiver for guidance concerning weight loss, quitting smoking, and alcohol consumption.  Avoid foods and drinks that make your symptoms worse, such as:  Caffeine or alcoholic drinks.  Chocolate.  Peppermint or mint flavorings.  Garlic and onions.  Spicy foods.  Citrus fruits, such as oranges, lemons, or limes.  Tomato-based foods such as sauce, chili, salsa, and pizza.  Fried and fatty foods.  Avoid lying down for the 3 hours prior to your bedtime or prior to taking a nap.  Eat small, frequent meals instead of large meals.  Wear loose-fitting clothing. Do not wear anything tight around your waist that causes pressure on your stomach.  Raise the head of your bed 6 to 8 inches with wood blocks to help you sleep. Extra pillows will not help.  Only take over-the-counter or prescription medicines for pain, discomfort, or fever as directed by your caregiver.  Do not take aspirin, ibuprofen, or other nonsteroidal anti-inflammatory drugs (NSAIDs). SEEK IMMEDIATE MEDICAL CARE IF:   You have pain in your arms, neck, jaw, teeth, or back.  Your pain increases or changes in intensity or duration.  You develop nausea, vomiting, or sweating (diaphoresis).  You develop shortness of breath, or you faint.  Your vomit is green, yellow, black, or looks like coffee grounds or blood.  Your stool is red, bloody, or black. These symptoms could be signs of other problems, such as heart disease, gastric bleeding, or esophageal bleeding. MAKE SURE YOU:   Understand these instructions.  Will watch your condition.  Will get help right away if you are not doing well or get worse. Document  Released: 05/25/2005 Document Revised: 11/07/2011 Document Reviewed: 03/04/2011 [facility] Patient Information 2013 [place], [place].

## 2012-08-16 NOTE — Progress Notes (Signed)
  Subjective:    Patient ID: Derek Thornton, male    DOB: [birth year], 21 y.o.   MRN: [id number]  HPI CC: ST, abd pain  Quit smoking several months ago.  Notices for the last 2 wks has noticed throat tender worse with drinking cold water, also having spots in back of throat.  Also with abd pain and increased BM, more loose, some diarrhea.  Having 2 BM/day.  Worse with spicy food.  abd pain described more with bloating/gas.  + heartburn/reflux recently with pizza/OJ.  No fevers/chills, headaches, coughing, congestion.  No nausea/vomiting.  No PNDrainage.  No allergy sxs or hx.  Denies sick contacts. Has started eating healthier, quit smoking, working out - lost 20lbs in last year.  Has been running more as well. No recent traveling.  Has used prilosec about 2-3 yrs ago with resolution of sxs.  Past Medical History  Diagnosis Date  . Palpitations   . Childhood asthma   . History of migraines   . Partial thickness burn of hand 2012    freon, R hand, treated with silvadene  . Concussion   . Fatigue 2012    blood work unrevealing, neuro eval benign    Review of Systems Per HPI    Objective:   Physical Exam  Nursing note and vitals reviewed. Constitutional: He appears well-developed and well-nourished. No distress.  HENT:  Head: Normocephalic and atraumatic.  Right Ear: Hearing, tympanic membrane, external ear and ear canal normal.  Left Ear: Hearing, tympanic membrane, external ear and ear canal normal.  Nose: Nose normal. No mucosal edema or rhinorrhea.  Mouth/Throat: Uvula is midline and mucous membranes are normal. Posterior oropharyngeal erythema present. No oropharyngeal exudate, posterior oropharyngeal edema or tonsillar abscesses.       Post oropharynx raw and some white drainage evident.  No exudates  Eyes: Conjunctivae normal and EOM are normal. Pupils are equal, round, and reactive to light.  Neck: Normal range of motion. Neck supple.  Cardiovascular: Normal rate,  regular rhythm, normal heart sounds and intact distal pulses.   No murmur heard. Pulmonary/Chest: Effort normal and breath sounds normal. No respiratory distress. He has no wheezes. He has no rales.  Abdominal: Soft. Bowel sounds are normal. He exhibits no distension and no mass. There is no hepatosplenomegaly. There is no tenderness. There is no rebound and no guarding.  Lymphadenopathy:    He has no cervical adenopathy.  Skin: Skin is warm and dry. No rash noted.  Psychiatric: He has a normal mood and affect.       Assessment & Plan:

## 2012-08-16 NOTE — Assessment & Plan Note (Signed)
Anticipate return of gerd given sxs exacerbated with acidic foods. Discussed GERD precautions. Treat with omeprazole 20mg  daily for 3 wks then may use pepcid/zantac PRN. If persistent, further evaluate, consider H pylori.

## 2012-09-06 ENCOUNTER — Telehealth: Payer: Self-pay | Admitting: Family Medicine

## 2012-09-06 NOTE — Telephone Encounter (Signed)
Pt's mother called and says pt has seen you in the past concerning his throat. Pt's throat is staying red and irritated and he wants to see if you will refer him to an ENT dr. Does he need to make an apptmt w/you for this referral, or can you refer him. Last visit for this issue was 08/16/2012. Thank you.

## 2012-09-06 NOTE — Telephone Encounter (Signed)
Patient's mom said he did take prilosec x 3 weeks. She will have him try the claritin or zyrtec and call back if no help for referral.

## 2012-09-06 NOTE — Telephone Encounter (Signed)
Last visit I thought this was related to GERD - did he take prilosec as prescribed 3 wks in a row and did it help?   At office visit he also did have significant post nasal drainage - I'd like him to try daily antihistamine like claritin or zyrtec for next 2-3 weeks.  If no better with this, call me for referral.

## 2012-10-03 ENCOUNTER — Encounter: Payer: Self-pay | Admitting: Family Medicine

## 2012-10-10 HISTORY — PX: ESOPHAGOGASTRODUODENOSCOPY: SHX1529

## 2012-11-01 ENCOUNTER — Encounter: Payer: Self-pay | Admitting: Family Medicine

## 2012-11-01 ENCOUNTER — Other Ambulatory Visit: Payer: Self-pay | Admitting: Family Medicine

## 2012-11-15 ENCOUNTER — Encounter: Payer: Self-pay | Admitting: Family Medicine

## 2012-12-01 ENCOUNTER — Emergency Department (HOSPITAL_COMMUNITY): Payer: 59

## 2012-12-01 ENCOUNTER — Encounter (HOSPITAL_COMMUNITY): Payer: Self-pay | Admitting: *Deleted

## 2012-12-01 ENCOUNTER — Emergency Department (HOSPITAL_COMMUNITY)
Admission: EM | Admit: 2012-12-01 | Discharge: 2012-12-01 | Disposition: A | Discharge status: Home / Self Care | Payer: 59 | Attending: Emergency Medicine | Admitting: Emergency Medicine

## 2012-12-01 DIAGNOSIS — J45909 Unspecified asthma, uncomplicated: Secondary | ICD-10-CM | POA: Insufficient documentation

## 2012-12-01 DIAGNOSIS — Z8679 Personal history of other diseases of the circulatory system: Secondary | ICD-10-CM | POA: Insufficient documentation

## 2012-12-01 DIAGNOSIS — IMO0002 Reserved for concepts with insufficient information to code with codable children: Secondary | ICD-10-CM | POA: Insufficient documentation

## 2012-12-01 DIAGNOSIS — S060X1A Concussion with loss of consciousness of 30 minutes or less, initial encounter: Secondary | ICD-10-CM

## 2012-12-01 DIAGNOSIS — Z87828 Personal history of other (healed) physical injury and trauma: Secondary | ICD-10-CM | POA: Insufficient documentation

## 2012-12-01 DIAGNOSIS — R55 Syncope and collapse: Secondary | ICD-10-CM | POA: Insufficient documentation

## 2012-12-01 DIAGNOSIS — K219 Gastro-esophageal reflux disease without esophagitis: Secondary | ICD-10-CM | POA: Insufficient documentation

## 2012-12-01 DIAGNOSIS — T07XXXA Unspecified multiple injuries, initial encounter: Secondary | ICD-10-CM

## 2012-12-01 DIAGNOSIS — Z87891 Personal history of nicotine dependence: Secondary | ICD-10-CM | POA: Insufficient documentation

## 2012-12-01 DIAGNOSIS — S022XXA Fracture of nasal bones, initial encounter for closed fracture: Secondary | ICD-10-CM

## 2012-12-01 DIAGNOSIS — H539 Unspecified visual disturbance: Secondary | ICD-10-CM | POA: Insufficient documentation

## 2012-12-01 DIAGNOSIS — Z79899 Other long term (current) drug therapy: Secondary | ICD-10-CM | POA: Insufficient documentation

## 2012-12-01 LAB — CBC WITH DIFFERENTIAL/PLATELET
Basophils Absolute: 0 10*3/uL (ref 0.0–0.1)
Eosinophils Relative: 0 % (ref 0–5)
HCT: 41 % (ref 39.0–52.0)
Hemoglobin: 14.8 g/dL (ref 13.0–17.0)
Lymphocytes Relative: 11 % — ABNORMAL LOW (ref 12–46)
Lymphs Abs: 1.1 10*3/uL (ref 0.7–4.0)
MCV: 84.5 fL (ref 78.0–100.0)
Monocytes Absolute: 0.5 10*3/uL (ref 0.1–1.0)
Monocytes Relative: 5 % (ref 3–12)
Neutro Abs: 8.3 10*3/uL — ABNORMAL HIGH (ref 1.7–7.7)
RDW: 12.3 % (ref 11.5–15.5)
WBC: 9.9 10*3/uL (ref 4.0–10.5)

## 2012-12-01 LAB — RAPID URINE DRUG SCREEN, HOSP PERFORMED
Cocaine: NOT DETECTED
Opiates: NOT DETECTED
Tetrahydrocannabinol: NOT DETECTED

## 2012-12-01 LAB — BASIC METABOLIC PANEL: GFR calc non Af Amer: >90 mL/min (ref >90)

## 2012-12-01 MED ORDER — HYDROCODONE-ACETAMINOPHEN 5-325 MG PO TABS
1.0000 | ORAL_TABLET | ORAL | Status: DC | PRN
Start: 2012-12-01 — End: 2013-08-19

## 2012-12-01 MED ORDER — IBUPROFEN 600 MG PO TABS: 600.0000 mg | ORAL_TABLET | Freq: Four times a day (QID) | ORAL | Status: DC | PRN

## 2012-12-01 MED ORDER — HYDROCODONE-ACETAMINOPHEN 5-325 MG PO TABS
1.0000 | ORAL_TABLET | Freq: Once | ORAL | Status: AC
  Administered 2012-12-01: 1 via ORAL
  Filled 2012-12-01: qty 1

## 2012-12-01 MED ORDER — ONDANSETRON 4 MG PO TBDP
4.0000 mg | ORAL_TABLET | Freq: Once | ORAL | Status: AC
  Administered 2012-12-01: 4 mg via ORAL
  Filled 2012-12-01: qty 1

## 2012-12-01 MED ORDER — ONDANSETRON 4 MG PO TBDP: ORAL_TABLET | ORAL | Status: DC
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
## 2012-12-01 NOTE — ED Notes (Signed)
PO fluids given
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
## 2013-08-19 ENCOUNTER — Ambulatory Visit (INDEPENDENT_AMBULATORY_CARE_PROVIDER_SITE_OTHER): Payer: 59 | Admitting: Family Medicine

## 2013-08-19 ENCOUNTER — Encounter: Payer: Self-pay | Admitting: Family Medicine

## 2013-08-19 VITALS — BP 132/72 | HR 87 | Temp 97.8°F | Ht 67.0 in | Wt 169.8 lb

## 2013-08-19 DIAGNOSIS — M545 Low back pain, unspecified: Secondary | ICD-10-CM

## 2013-08-19 DIAGNOSIS — S39012A Strain of muscle, fascia and tendon of lower back, initial encounter: Secondary | ICD-10-CM | POA: Insufficient documentation

## 2013-08-19 DIAGNOSIS — S335XXA Sprain of ligaments of lumbar spine, initial encounter: Secondary | ICD-10-CM

## 2013-08-19 LAB — POCT URINALYSIS DIPSTICK
Blood, UA: NEGATIVE
Glucose, UA: NEGATIVE
Spec Grav, UA: <=1.005
Urobilinogen, UA: 0.2

## 2013-08-19 MED ORDER — CYCLOBENZAPRINE HCL 10 MG PO TABS: 5.0000 mg | ORAL_TABLET | Freq: Two times a day (BID) | ORAL | Status: DC | PRN
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
## 2013-08-19 NOTE — Progress Notes (Signed)
Pre-visit discussion using our clinic review tool. No additional management support is needed unless otherwise documented below in the visit note.  

## 2013-08-19 NOTE — Patient Instructions (Signed)
I think you do have lumbar strain - treat with ibuprofen over the counter and flexeril muscle relaxant (1/2-1 tablet as needed) Do stretching exercises provided today. Let me know if not better for physical therapy.

## 2013-08-19 NOTE — Assessment & Plan Note (Signed)
No red flags. Treat with ibuprofen and flexeril - sedation precautions discussed. Provided with stretching exercises from [facility] pt advisor. Update if sxs persist to discuss PT.

## 2013-11-13 ENCOUNTER — Encounter: Payer: Self-pay | Admitting: Internal Medicine

## 2013-11-13 ENCOUNTER — Ambulatory Visit (INDEPENDENT_AMBULATORY_CARE_PROVIDER_SITE_OTHER): Payer: 59 | Admitting: Internal Medicine

## 2013-11-13 VITALS — BP 118/82 | HR 89 | Temp 98.6°F | Wt 168.0 lb

## 2013-11-13 DIAGNOSIS — A088 Other specified intestinal infections: Secondary | ICD-10-CM

## 2013-11-13 DIAGNOSIS — A084 Viral intestinal infection, unspecified: Secondary | ICD-10-CM

## 2013-11-13 NOTE — Patient Instructions (Addendum)
Viral Gastroenteritis Viral gastroenteritis is also known as stomach flu. This condition affects the stomach and intestinal tract. It can cause sudden diarrhea and vomiting. The illness typically lasts 3 to 8 days. Most people develop an immune response that eventually gets rid of the virus. While this natural response develops, the virus can make you quite ill. CAUSES  Many different viruses can cause gastroenteritis, such as rotavirus or noroviruses. You can catch one of these viruses by consuming contaminated food or water. You may also catch a virus by sharing utensils or other personal items with an infected person or by touching a contaminated surface. SYMPTOMS  The most common symptoms are diarrhea and vomiting. These problems can cause a severe loss of body fluids (dehydration) and a body salt (electrolyte) imbalance. Other symptoms may include:  Fever.  Headache.  Fatigue.  Abdominal pain. DIAGNOSIS  Your caregiver can usually diagnose viral gastroenteritis based on your symptoms and a physical exam. A stool sample may also be taken to test for the presence of viruses or other infections. TREATMENT  This illness typically goes away on its own. Treatments are aimed at rehydration. The most serious cases of viral gastroenteritis involve vomiting so severely that you are not able to keep fluids down. In these cases, fluids must be given through an intravenous line (IV). HOME CARE INSTRUCTIONS   Drink enough fluids to keep your urine clear or pale yellow. Drink small amounts of fluids frequently and increase the amounts as tolerated.  Ask your caregiver for specific rehydration instructions.  Avoid:  Foods high in sugar.  Alcohol.  Carbonated drinks.  Tobacco.  Juice.  Caffeine drinks.  Extremely hot or cold fluids.  Fatty, greasy foods.  Too much intake of anything at one time.  Dairy products until 24 to 48 hours after diarrhea stops.  You may consume probiotics.  Probiotics are active cultures of beneficial bacteria. They may lessen the amount and number of diarrheal stools in adults. Probiotics can be found in yogurt with active cultures and in supplements.  Wash your hands well to avoid spreading the virus.  Only take over-the-counter or prescription medicines for pain, discomfort, or fever as directed by your caregiver. Do not give aspirin to children. Antidiarrheal medicines are not recommended.  Ask your caregiver if you should continue to take your regular prescribed and over-the-counter medicines.  Keep all follow-up appointments as directed by your caregiver. SEEK IMMEDIATE MEDICAL CARE IF:   You are unable to keep fluids down.  You do not urinate at least once every 6 to 8 hours.  You develop shortness of breath.  You notice blood in your stool or vomit. This may look like coffee grounds.  You have abdominal pain that increases or is concentrated in one small area (localized).  You have persistent vomiting or diarrhea.  You have a fever.  The patient is a child younger than 3 months, and he or she has a fever.  The patient is a child older than 3 months, and he or she has a fever and persistent symptoms.  The patient is a child older than 3 months, and he or she has a fever and symptoms suddenly get worse.  The patient is a baby, and he or she has no tears when crying. MAKE SURE YOU:   Understand these instructions.  Will watch your condition.  Will get help right away if you are not doing well or get worse. Document Released: 08/15/2005 Document Revised: 11/07/2011 Document Reviewed: 06/01/2011   [employer] Patient Information 2014 [employer]  

## 2013-11-13 NOTE — Progress Notes (Signed)
Pre visit review using our clinic review tool, if applicable. No additional management support is needed unless otherwise documented below in the visit note. 
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
## 2014-08-06 ENCOUNTER — Encounter: Payer: Self-pay | Admitting: Family Medicine

## 2014-08-06 ENCOUNTER — Ambulatory Visit: Payer: 59 | Admitting: Internal Medicine

## 2014-08-06 ENCOUNTER — Ambulatory Visit (INDEPENDENT_AMBULATORY_CARE_PROVIDER_SITE_OTHER): Payer: BC Managed Care – PPO | Admitting: Family Medicine

## 2014-08-06 VITALS — BP 116/78 | HR 83 | Temp 98.5°F | Wt 158.5 lb

## 2014-08-06 DIAGNOSIS — J029 Acute pharyngitis, unspecified: Secondary | ICD-10-CM

## 2014-08-06 MED ORDER — AMOXICILLIN 875 MG PO TABS: 875.0000 mg | ORAL_TABLET | Freq: Two times a day (BID) | ORAL | Status: DC

## 2014-08-06 NOTE — Addendum Note (Signed)
Addended by: Desmond DikeKNIGHT, Safaa Stingley H on: 08/06/2014 11:09 AM   Modules accepted: Orders

## 2014-08-06 NOTE — Patient Instructions (Signed)
Good to see you. Please take Amoxicillin as directed- 1 tablet twice daily for 10 days. Ibuprofen/Alleve as needed for pain. Call us if you are not improving over next 5-7 days, sooner if your symptoms are worsening.
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
## 2014-08-06 NOTE — Progress Notes (Signed)
Pre visit review using our clinic review tool, if applicable. No additional management support is needed unless otherwise documented below in the visit note. 

## 2014-08-08 LAB — CULTURE, GROUP A STREP: ORGANISM ID, BACTERIA: NORMAL

## 2015-03-24 ENCOUNTER — Encounter: Payer: Self-pay | Admitting: Internal Medicine

## 2015-03-24 ENCOUNTER — Ambulatory Visit (INDEPENDENT_AMBULATORY_CARE_PROVIDER_SITE_OTHER): Payer: BLUE CROSS/BLUE SHIELD | Admitting: Internal Medicine

## 2015-03-24 VITALS — BP 126/82 | HR 75 | Temp 98.7°F | Wt 150.0 lb

## 2015-03-24 DIAGNOSIS — R197 Diarrhea, unspecified: Secondary | ICD-10-CM

## 2015-03-24 DIAGNOSIS — R112 Nausea with vomiting, unspecified: Secondary | ICD-10-CM | POA: Diagnosis not present
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
## 2015-03-24 NOTE — Patient Instructions (Signed)

## 2015-03-24 NOTE — Progress Notes (Signed)
Pre visit review using our clinic review tool, if applicable. No additional management support is needed unless otherwise documented below in the visit note. 

## 2015-07-22 ENCOUNTER — Ambulatory Visit (INDEPENDENT_AMBULATORY_CARE_PROVIDER_SITE_OTHER): Payer: Self-pay | Admitting: Family Medicine

## 2015-07-22 VITALS — BP 124/64 | HR 86 | Temp 98.0°F | Resp 16 | Ht 66.0 in | Wt 157.6 lb

## 2015-07-22 DIAGNOSIS — Z029 Encounter for administrative examinations, unspecified: Secondary | ICD-10-CM

## 2015-07-22 DIAGNOSIS — Z024 Encounter for examination for driving license: Secondary | ICD-10-CM

## 2015-07-22 NOTE — Progress Notes (Signed)
Patient ID: Derek Thornton, male    DOB: 07/02/1991  Age: 24 y.o. MRN: [id number]  Chief Complaint  Patient presents with  . 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 No complaints.  Past medical history: Operations: None Major illnesses: None Regular medications: None Allergies: None   Family history: Parents are living and well  Social history: Patient is going to be working for [employer]. He works out and is physically healthy as far as he knows. He is single but not yet married to his girlfriend. No children. He drinks occasionally. Does not smoke or use drugs.  Review of systems: Unremarkable     Current allergies, medications, problem list, past/family and social histories reviewed.    ihj  Objective:  BP 124/64 mmHg  Pulse 86  Temp(Src) 98 F (36.7 C) (Oral)  Resp 16  Ht 5\' 6"  (1.676 m)  Wt 157 lb 9.6 oz (71.487 kg)  BMI 25.45 kg/m2  SpO2 99%  Healthy-appearing young man in no acute distress. TMs normal. Throat clear. Eyes PERRLA. Neck supple without nodes. Chest is clear to auscultation. Heart regular without murmurs. Abdomen soft without mass or tenderness. Normal male external genitalia with testes descended. No hernias. Extremities unremarkable. Skin unremarkable.  Assessment & Plan:   Assessment: 1. Driver's permit physical examination       Plan: 2 year card      Patient Instructions  You will receive a 2 year card. Return before it expires.  Return at any time if needed for other health issues.     No Follow-up on file.   HOPPER,DAVID, MD 07/22/2015

## 2015-07-22 NOTE — Patient Instructions (Signed)
You will receive a 2 year card. Return before it expires.  Return at any time if needed for other health issues.

## 2017-01-18 ENCOUNTER — Encounter: Payer: Self-pay | Admitting: Family Medicine

## 2017-01-18 ENCOUNTER — Ambulatory Visit (INDEPENDENT_AMBULATORY_CARE_PROVIDER_SITE_OTHER): Payer: BLUE CROSS/BLUE SHIELD | Admitting: Family Medicine

## 2017-01-18 VITALS — BP 120/82 | HR 64 | Temp 98.1°F | Resp 16 | Ht 66.0 in | Wt 164.1 lb

## 2017-01-18 DIAGNOSIS — L74513 Primary focal hyperhidrosis, soles: Secondary | ICD-10-CM | POA: Insufficient documentation

## 2017-01-18 DIAGNOSIS — L309 Dermatitis, unspecified: Secondary | ICD-10-CM

## 2017-01-18 DIAGNOSIS — Z23 Encounter for immunization: Secondary | ICD-10-CM | POA: Diagnosis not present

## 2017-01-18 MED ORDER — ALUMINUM CHLORIDE 20 % EX SOLN: Freq: Every day | CUTANEOUS | 11 refills | Status: DC

## 2017-01-18 MED ORDER — BETAMETHASONE DIPROPIONATE 0.05 % EX CREA: TOPICAL_CREAM | Freq: Two times a day (BID) | CUTANEOUS | 11 refills | Status: DC

## 2017-01-18 NOTE — Progress Notes (Signed)
    Chief Complaint  Patient presents with  . Rash    feet   Here for an acute visit Unable to go to work today due to rash on his feet and could not stand to put on work boots. Works as a Copywriter, advertisinglineman, and is out in the heat all day.  He has "sweaty feet" and his feet are soaked early in the day and remain in wet boots through the days climbing and activities.  He has developed a rash and open wounds on his feet that he is unable to manage on his own.  He has tried soaks, OTC creams, changing boots, changing socks.    Patient Active Problem List   Diagnosis Date Noted  . 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 GERD (gastroesophageal reflux disease)     Outpatient Encounter Prescriptions as of 01/18/2017  Medication Sig  . aluminum chloride (DRYSOL) 20 % external solution Apply topically at bedtime.  . betamethasone dipropionate (DIPROLENE) 0.05 % cream Apply topically 2 (two) times daily.   No facility-administered encounter medications on file as of 01/18/2017.     No Known Allergies  Review of Systems  Constitutional: Negative for activity change, appetite change, fatigue and unexpected weight change.  HENT: Negative for congestion and dental problem.   Eyes: Negative for redness and visual disturbance.  Respiratory: Negative for cough and shortness of breath.   Cardiovascular: Negative for palpitations and leg swelling.  Gastrointestinal: Negative for constipation and diarrhea.  Genitourinary: Negative for dysuria and frequency.  Musculoskeletal: Negative for arthralgias and back pain.  Skin: Positive for color change and rash.  Neurological: Negative for dizziness and headaches.  Hematological: Negative for adenopathy. Does not bruise/bleed easily.  Psychiatric/Behavioral: Negative for sleep disturbance.    BP 120/82 (BP Location: Right Arm, Patient Position: Sitting, Cuff Size: Normal)   Pulse 64   Temp 98.1 F (36.7 C) (Temporal)   Resp 16   Ht 5\' 6"  (1.676 m)   Wt 164 lb  1.9 oz (74.4 kg)   SpO2 99%   BMI 26.49 kg/m   Physical Exam  Constitutional: He is oriented to person, place, and time. He appears well-developed and well-nourished. No distress.  HENT:  Head: Normocephalic and atraumatic.  Mouth/Throat: Oropharynx is clear and moist.  Eyes: Conjunctivae are normal. Pupils are equal, round, and reactive to light.  Musculoskeletal: He exhibits no edema.  Neurological: He is oriented to person, place, and time.  Skin: Rash noted.  Both feet with erythematous patches. circular and irregular,  No vesicle or scale.  Some open with bleeding. ONLY top/volar.  Soles normal.  NO rash between toes.  Psychiatric: He has a normal mood and affect. His behavior is normal. Thought content normal.    ASSESSMENT/PLAN:  1. Hyperhidrosis of soles drysol with instructions for use  2. Dermatitis betamethasone. 3. Need TdaP Wife is expecting  Patient Instructions  Use the dry sol to reduce sweating Use the betamethasone cream to clear the rash Continue to change socks when you can Call me for problems    Eustace MooreYvonne Sue [place] Markgraf, MD

## 2017-01-18 NOTE — Patient Instructions (Addendum)
Use the dry sol to reduce sweating Use the betamethasone cream to clear the rash Continue to change socks when you can Call me for problems

## 2018-03-23 ENCOUNTER — Telehealth: Payer: Self-pay

## 2018-03-23 DIAGNOSIS — L309 Dermatitis, unspecified: Secondary | ICD-10-CM

## 2018-03-23 MED ORDER — BETAMETHASONE DIPROPIONATE 0.05 % EX CREA: TOPICAL_CREAM | Freq: Two times a day (BID) | CUTANEOUS | 1 refills | Status: DC | PRN

## 2018-03-23 NOTE — Telephone Encounter (Signed)
UPDATE-----Pt needs it to go to [facility] instead due to his insurance

## 2018-03-23 NOTE — Telephone Encounter (Signed)
Please advise medication was sent into pharmacy as requested.  Please advised to keep scheduled follow-up.  Please advised to use medication sparingly as this is a steroid cream.  Please advise not apply to neck, face, or other areas of skin other than to area directed.

## 2018-03-23 NOTE — Telephone Encounter (Signed)
Seen Dr Delton SeeNelson in the past and was given a cream for his feet. Requesting a refill be sent to [facility] [place]

## 2018-06-14 ENCOUNTER — Encounter: Payer: Self-pay | Admitting: Family Medicine

## 2018-06-14 ENCOUNTER — Ambulatory Visit (INDEPENDENT_AMBULATORY_CARE_PROVIDER_SITE_OTHER): Payer: 59

## 2018-06-14 ENCOUNTER — Ambulatory Visit (INDEPENDENT_AMBULATORY_CARE_PROVIDER_SITE_OTHER): Payer: 59 | Admitting: Family Medicine

## 2018-06-14 VITALS — BP 132/78 | HR 77 | Temp 98.2°F | Ht 65.5 in | Wt 151.0 lb

## 2018-06-14 DIAGNOSIS — R0781 Pleurodynia: Secondary | ICD-10-CM

## 2018-06-14 DIAGNOSIS — M542 Cervicalgia: Secondary | ICD-10-CM

## 2018-06-14 DIAGNOSIS — M546 Pain in thoracic spine: Secondary | ICD-10-CM

## 2018-06-14 DIAGNOSIS — G8929 Other chronic pain: Secondary | ICD-10-CM

## 2018-06-14 DIAGNOSIS — M545 Low back pain, unspecified: Secondary | ICD-10-CM | POA: Insufficient documentation

## 2018-06-14 DIAGNOSIS — Z23 Encounter for immunization: Secondary | ICD-10-CM

## 2018-06-14 LAB — CBC WITH DIFFERENTIAL/PLATELET
BASOS PCT: 0.4 % (ref 0.0–3.0)
Basophils Absolute: 0 10*3/uL (ref 0.0–0.1)
EOS ABS: 0 10*3/uL (ref 0.0–0.7)
Eosinophils Relative: 0.5 % (ref 0.0–5.0)
HEMATOCRIT: 45.6 % (ref 39.0–52.0)
HEMOGLOBIN: 16 g/dL (ref 13.0–17.0)
LYMPHS PCT: 27.6 % (ref 12.0–46.0)
Lymphs Abs: 1.5 10*3/uL (ref 0.7–4.0)
MCHC: 35 g/dL (ref 30.0–36.0)
MCV: 85.9 fl (ref 78.0–100.0)
Monocytes Absolute: 0.3 10*3/uL (ref 0.1–1.0)
Monocytes Relative: 5 % (ref 3.0–12.0)
Neutro Abs: 3.6 10*3/uL (ref 1.4–7.7)
Neutrophils Relative %: 66.5 % (ref 43.0–77.0)
Platelets: 295 10*3/uL (ref 150.0–400.0)
RBC: 5.31 Mil/uL (ref 4.22–5.81)
RDW: 12.9 % (ref 11.5–15.5)
WBC: 5.4 10*3/uL (ref 4.0–10.5)

## 2018-06-14 LAB — COMPREHENSIVE METABOLIC PANEL
ALBUMIN: 4.9 g/dL (ref 3.5–5.2)
ALK PHOS: 72 U/L (ref 39–117)
ALT: 32 U/L (ref 0–53)
AST: 25 U/L (ref 0–37)
BILIRUBIN TOTAL: 1.3 mg/dL — AB (ref 0.2–1.2)
BUN: 14 mg/dL (ref 6–23)
CALCIUM: 9.8 mg/dL (ref 8.4–10.5)
CO2: 28 mEq/L (ref 19–32)
CREATININE: 1 mg/dL (ref 0.40–1.50)
Chloride: 102 mEq/L (ref 96–112)
GFR: 94.03 mL/min (ref >60.00)
Glucose, Bld: 87 mg/dL (ref 70–99)
Potassium: 3.9 mEq/L (ref 3.5–5.1)
Sodium: 141 mEq/L (ref 135–145)
TOTAL PROTEIN: 7.8 g/dL (ref 6.0–8.3)

## 2018-06-14 LAB — B12 AND FOLATE PANEL
Folate: 17.8 ng/mL (ref >5.9)
VITAMIN B 12: 530 pg/mL (ref 211–911)

## 2018-06-14 LAB — VITAMIN D 25 HYDROXY (VIT D DEFICIENCY, FRACTURES): VITD: 56.56 ng/mL (ref 30.00–100.00)
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
## 2018-06-14 NOTE — Patient Instructions (Signed)
Take Aleve 220 mg 2 times per day for at least next week to see how you feel

## 2018-06-15 ENCOUNTER — Telehealth: Payer: Self-pay | Admitting: Family Medicine

## 2018-06-15 LAB — THYROID PANEL WITH TSH
Free Thyroxine Index: 2.7 (ref 1.4–3.8)
T3 Uptake: 31 % (ref 22–35)
T4, Total: 8.6 ug/dL (ref 4.9–10.5)
TSH: 1.35 m[IU]/L (ref 0.40–4.50)

## 2018-06-15 NOTE — Telephone Encounter (Signed)
Patient is calling back for his results labs and xrays [phone]

## 2018-06-15 NOTE — Telephone Encounter (Signed)
Called Pt with results, Pt stated he understood and had no questions.

## 2018-06-15 NOTE — Telephone Encounter (Signed)
Copied from CRM [id number]. Topic: Quick Communication - Other Results (Clinic Use ONLY) >> Jun 15, 2018 10:23 AM Gloriann Loan L wrote: Pt calling to get results of xray

## 2018-06-29 ENCOUNTER — Ambulatory Visit (INDEPENDENT_AMBULATORY_CARE_PROVIDER_SITE_OTHER): Payer: Self-pay | Admitting: Family Medicine

## 2019-06-19 ENCOUNTER — Telehealth: Payer: Self-pay | Admitting: Family Medicine

## 2019-06-19 ENCOUNTER — Ambulatory Visit: Payer: Self-pay | Admitting: *Deleted

## 2019-06-19 ENCOUNTER — Emergency Department
Admission: EM | Admit: 2019-06-19 | Discharge: 2019-06-19 | Disposition: A | Discharge status: Home / Self Care | Payer: 59 | Attending: Emergency Medicine | Admitting: Emergency Medicine

## 2019-06-19 ENCOUNTER — Other Ambulatory Visit: Payer: Self-pay

## 2019-06-19 DIAGNOSIS — H538 Other visual disturbances: Secondary | ICD-10-CM | POA: Diagnosis present

## 2019-06-19 DIAGNOSIS — G43809 Other migraine, not intractable, without status migrainosus: Secondary | ICD-10-CM

## 2019-06-19 DIAGNOSIS — Z87891 Personal history of nicotine dependence: Secondary | ICD-10-CM | POA: Diagnosis not present

## 2019-06-19 DIAGNOSIS — R202 Paresthesia of skin: Secondary | ICD-10-CM | POA: Diagnosis not present

## 2019-06-19 LAB — CBC
HCT: 43 % (ref 39.0–52.0)
Hemoglobin: 15.2 g/dL (ref 13.0–17.0)
MCH: 29.7 pg (ref 26.0–34.0)
MCHC: 35.3 g/dL (ref 30.0–36.0)
MCV: 84.1 fL (ref 80.0–100.0)
Platelets: 305 10*3/uL (ref 150–400)
RBC: 5.11 MIL/uL (ref 4.22–5.81)
RDW: 11.9 % (ref 11.5–15.5)
WBC: 10.5 10*3/uL (ref 4.0–10.5)
nRBC: 0 % (ref 0.0–0.2)

## 2019-06-19 LAB — GLUCOSE, CAPILLARY: Glucose-Capillary: 85 mg/dL (ref 70–99)

## 2019-06-19 LAB — BASIC METABOLIC PANEL
Anion gap: 12 (ref 5–15)
BUN: 14 mg/dL (ref 6–20)
CO2: 27 mmol/L (ref 22–32)
Calcium: 9.6 mg/dL (ref 8.9–10.3)
Chloride: 99 mmol/L (ref 98–111)
Creatinine, Ser: 0.87 mg/dL (ref 0.61–1.24)
GFR calc Af Amer: >60 mL/min (ref >60)
GFR calc non Af Amer: >60 mL/min (ref >60)
Glucose, Bld: 118 mg/dL — ABNORMAL HIGH (ref 70–99)
Potassium: 3.6 mmol/L (ref 3.5–5.1)
Sodium: 138 mmol/L (ref 135–145)

## 2019-06-19 MED ORDER — BUTALBITAL-APAP-CAFFEINE 50-325-40 MG PO TABS: 1.0000 | ORAL_TABLET | Freq: Four times a day (QID) | ORAL | 0 refills | Status: AC | PRN

## 2019-06-19 MED ORDER — BUTALBITAL-APAP-CAFFEINE 50-325-40 MG PO TABS
1.0000 | ORAL_TABLET | Freq: Once | ORAL | Status: AC
  Administered 2019-06-19: 19:00:00 1 via ORAL
  Filled 2019-06-19: qty 1

## 2019-06-19 NOTE — Telephone Encounter (Signed)
Called patient on his cell phone but was unable to leave a message.  Left message on patient's home number to call office back.

## 2019-06-19 NOTE — Telephone Encounter (Signed)
Pt called stating he is experiencing a migraine and had associated blurred peripheral vision, he is currently having tingling/numbness in his left arm and both feet; his symptoms started around 1300; he says that normally he takes [facility] powder, but he has not taken this today; he says that the pain started in his neck and the back of his head; he says the pain is dull, and he feels it in his eyes R>L; the pt rates his pain at 3-4 out of 10; he says the blurred vision lasted 20-30 min; recommendations made per nurse triage protocol; he verbalized understanding; the pt sees Eugenio Saenz; attempted [facility] x 2 without success; the pt can be contacted at [phone]; will route to office for final disposition    Reason for Disposition . Loss of vision or double vision (Exception: same as prior migraines)  Answer Assessment - Initial Assessment Questions 1. LOCATION: "Where does it hurt?"      Back of head and left neck 2. ONSET: "When did the headache start?" (Minutes, hours or days)      06/19/2019 1220 3. PATTERN: "Does the pain come and go, or has it been constant since it started?"    constant 4. SEVERITY: "How bad is the pain?" and "What does it keep you from doing?"  (e.g., Scale 1-10; mild, moderate, or severe)   - MILD (1-3): doesn't interfere with normal activities    - MODERATE (4-7): interferes with normal activities or awakens from sleep    - SEVERE (8-10): excruciating pain, unable to do any normal activities        3-4 out of 10 5. RECURRENT SYMPTOM: "Have you ever had headaches before?" If so, ask: "When was the last time?" and "What happened that time?"      Yes, 2-3 months ago 6. CAUSE: "What do you think is causing the headache?"    migraine 7. MIGRAINE: "Have you been diagnosed with migraine headaches?" If so, ask: "Is this headache similar?"      Yes; pt does have associated numbess/tingling, but the blurred peripheral vision is new 8. HEAD INJURY: "Has there been any  recent injury to the head?"      no 9. OTHER SYMPTOMS: "Do you have any other symptoms?" (fever, stiff neck, eye pain, sore throat, cold symptoms)    Pain right side of neck (normal when he has headache) 10. PREGNANCY: "Is there any chance you are pregnant?" "When was your last menstrual period?"       n/a  Protocols used: HEADACHE-A-AH

## 2019-06-19 NOTE — Telephone Encounter (Signed)
Called and advise patient of MD agreement that he should be evaluated to rule out any underlying problem and make sure this is migraine related.

## 2019-06-19 NOTE — Telephone Encounter (Signed)
Notified Butch Penny at [facility].

## 2019-06-19 NOTE — ED Provider Notes (Signed)
[facility] Emergency Department Provider Note   ____________________________________________   I have reviewed the triage vital signs and the nursing notes.   HISTORY  Chief Complaint Blurred Vision and Headache   History limited by: Not Limited   HPI Derek Thornton is a 28 y.o. male who presents to the emergency department today because of concerns for migraine and neurologic changes.  Patient states he has a history of migraines when he was younger.  He had his first 1 in quite a while about 3 months ago.  He states he then had another migraine today.  Before the migraine started he did have some darkening of his vision and tingling throughout his extremities.  He states this is happened to him before.  He tried calling his doctor to get a prescription for migraine medication however upon hearing the neurologic symptoms they did ask to get evaluated in the emergency department. Patient denies any recent head trauma.   Records reviewed. Per medical record review patient has a history of GERD, concussion.  Past Medical History:  Diagnosis Date  . Childhood asthma   . Concussion   . Fatigue 2012   blood work unrevealing, neuro eval benign  . GERD (gastroesophageal reflux disease)    moderate by barium swallow 2010  . History of migraines   . Neuromuscular disorder ([place])   . Palpitations   . Partial thickness burn of hand 2012   freon, R hand, treated with silvadene    Patient Active Problem List   Diagnosis Date Noted  . Chronic bilateral low back pain 06/14/2018  . Chronic bilateral thoracic back pain 06/14/2018  . Chronic neck pain 06/14/2018  . 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 GERD (gastroesophageal reflux disease)     Past Surgical History:  Procedure Laterality Date  . ESOPHAGOGASTRODUODENOSCOPY  10/10/2012   small HH, GERD, normal stomach and duodenum Oletta Lamas)    Prior to Admission medications   Medication Sig Start Date End Date  Taking? Authorizing Provider  betamethasone dipropionate (DIPROLENE) 0.05 % cream Apply topically 2 (two) times daily as needed. 03/23/18   Caren Macadam, MD    Allergies Patient has no known allergies.  Family History  Problem Relation Age of Onset  . Arthritis Mother   . Cancer Maternal Grandfather     Social History Social History   Tobacco Use  . Smoking status: Former Smoker    Years: 1.00    Types: Cigarettes    Quit date: 08/29/2010    Years since quitting: 8.8  . Smokeless tobacco: Never Used  Substance Use Topics  . Alcohol use: Not Currently    Alcohol/week: 0.0 standard drinks    Comment: occasional   . Drug use: No    Review of Systems Constitutional: No fever/chills Eyes: Positive for blurry vision.  ENT: No sore throat. Cardiovascular: Denies chest pain. Respiratory: Denies shortness of breath. Gastrointestinal: No abdominal pain.  No nausea, no vomiting.  No diarrhea.   Genitourinary: Negative for dysuria. Musculoskeletal: Negative for back pain. Skin: Negative for rash. Neurological: Positive for headache.   ____________________________________________   PHYSICAL EXAM:  VITAL SIGNS: ED Triage Vitals  Enc Vitals Group     BP 06/19/19 1623 113/65     Pulse Rate 06/19/19 1623 79     Resp 06/19/19 1623 18     Temp 06/19/19 1623 98.9 F (37.2 C)     Temp Source 06/19/19 1623 Oral     SpO2 06/19/19 1623 100 %  Weight 06/19/19 1623 155 lb (70.3 kg)     Height 06/19/19 1623 5\' 7"  (1.702 m)     Head Circumference --      Peak Flow --      Pain Score 06/19/19 1631 5   Constitutional: Alert and oriented.  Eyes: Conjunctivae are normal.  ENT      Head: Normocephalic and atraumatic.      Nose: No congestion/rhinnorhea.      Mouth/Throat: Mucous membranes are moist.      Neck: No stridor. Hematological/Lymphatic/Immunilogical: No cervical lymphadenopathy. Cardiovascular: Normal rate, regular rhythm.  No murmurs, rubs, or gallops.   Respiratory: Normal respiratory effort without tachypnea nor retractions. Breath sounds are clear and equal bilaterally. No wheezes/rales/rhonchi. Gastrointestinal: Soft and non tender. No rebound. No guarding.  Genitourinary: Deferred Musculoskeletal: Normal range of motion in all extremities. No lower extremity edema. Neurologic:  Normal speech and language. No gross focal neurologic deficits are appreciated.  Skin:  Skin is warm, dry and intact. No rash noted. Psychiatric: Mood and affect are normal. Speech and behavior are normal. Patient exhibits appropriate insight and judgment.  ____________________________________________    LABS (pertinent positives/negatives)  CBC wbc 10.5, hgb 15.2, plt 305 BMP wnl except glu 118 ____________________________________________   EKG  None  ____________________________________________    RADIOLOGY  None  ____________________________________________   PROCEDURES  Procedures  ____________________________________________   INITIAL IMPRESSION / ASSESSMENT AND PLAN / ED COURSE  Pertinent labs & imaging results that were available during my care of the patient were reviewed by me and considered in my medical decision making (see chart for details).   Patient presented to the emergency department today because of concerns for headache and some neurologic symptoms.  By the time my exam his neurologic symptoms had resolved.  Still complaining of headache.  This point I doubt CVA given that the patient has had similar symptoms in the past with migraines.  Patient was given Fioricet with relief of his headache.  Discussed with patient importance of following up with neurology.  ____________________________________________   FINAL CLINICAL IMPRESSION(S) / ED DIAGNOSES  Final diagnoses:  Other migraine without status migrainosus, not intractable     Note: This dictation was prepared with Dragon dictation. Any transcriptional errors  that result from this process are unintentional     06/21/19, MD 06/19/19 1952

## 2019-06-19 NOTE — ED Triage Notes (Signed)
Pt comes via POV from home with c/o headache, numbness in bilateral arms. Pt states some blurred vision. Pt also states numbness to legs.  Pt states this all started at 12:15. Pt states all symptoms have subsided except the headache.  Mini neuro negative, VAN negative.  Pt appears in NAD at this time.

## 2019-06-19 NOTE — ED Notes (Addendum)
Pt states pain is not better. md aware

## 2019-06-19 NOTE — Telephone Encounter (Signed)
Called notified patient he needs to go to ER and rule out underlying causes , notified ER patient in route by car.

## 2019-06-19 NOTE — Discharge Instructions (Signed)
Please seek medical attention for any high fevers, chest pain, shortness of breath, change in behavior, persistent vomiting, bloody stool or any other new or concerning symptoms.  

## 2019-06-19 NOTE — Telephone Encounter (Signed)
I agree with ED evaluation. While these may be migraines there are features that are worrisome for a more significant underlying cause.

## 2019-06-19 NOTE — Telephone Encounter (Signed)
Patient says that he has s had migraines as a child  And had not one since childhood until  about 90 days same thing happened tingling in the arms feet and gets splotchy or blurry vision. He is feeling better now just dull ache in his head he is home and not driving, the last headache was brought on by exertion and heat patient thought to day just came on suddenly at work. Mother has HX of migraines, same symptoms no HX of hypertension or aneurysm, advised patient I feel he should be evaluate due to he has not had an episode until since childhood. I would confer with MD in office if he agrees will go to ER patient says yes.

## 2019-06-19 NOTE — ED Notes (Signed)
Pt reports right side headache since noon today.  Pt has nausea.  No v/d.  Pt states h/a started at lunch today.  Pt took tylenol without relief  Hx migraine h/a's.  Pt alert  Speech clear.

## 2019-06-19 NOTE — Telephone Encounter (Signed)
Pt's wife called stating that her husband ( the patient) has lost his peripheral vision and was leaving work. And now his legs are not working as well.  She does not know his location but feels like he should be at home. Advised to call 911. Will try to call the patient.  Pt called and stated that he is having a migraine is why he was losing his peripheral vision but he is ok now. He denies confusion and no slurred speech , or weakness.  He stated that he had been spoken with another nurse today regarding his symptoms. She had advised for him to go to the ED for his symptoms. Now has a dull headache now and will take a [facility] for his headache. Will like for his provider to call him back regarding getting medication for migraines. Routing to LB at [facility] for review.  Reason for Disposition . Requesting regular office appointment  Answer Assessment - Initial Assessment Questions 1. REASON FOR CALL or QUESTION: "What is your reason for calling today?" or "How can I best help you?" or "What question do you have that I can help answer?"     Losing peripheral vision  Protocols used: INFORMATION ONLY CALL - NO TRIAGE-A-AH

## 2019-06-23 ENCOUNTER — Other Ambulatory Visit: Payer: Self-pay

## 2019-06-23 ENCOUNTER — Encounter: Payer: Self-pay | Admitting: Emergency Medicine

## 2019-06-23 ENCOUNTER — Emergency Department
Admission: EM | Admit: 2019-06-23 | Discharge: 2019-06-23 | Disposition: A | Discharge status: Home / Self Care | Payer: 59 | Attending: Emergency Medicine | Admitting: Emergency Medicine

## 2019-06-23 ENCOUNTER — Emergency Department: Payer: 59

## 2019-06-23 DIAGNOSIS — Z87891 Personal history of nicotine dependence: Secondary | ICD-10-CM | POA: Insufficient documentation

## 2019-06-23 DIAGNOSIS — G43819 Other migraine, intractable, without status migrainosus: Secondary | ICD-10-CM

## 2019-06-23 DIAGNOSIS — R42 Dizziness and giddiness: Secondary | ICD-10-CM | POA: Diagnosis present

## 2019-06-23 DIAGNOSIS — R202 Paresthesia of skin: Secondary | ICD-10-CM | POA: Diagnosis not present

## 2019-06-23 NOTE — ED Triage Notes (Signed)
Pt presents to ED via POV. Reports he was seen 10/21 for migraine with BUE/BLE numbness and since then he has felt dizzy and like he has "brain fog." Pt denies any current headache or any other neuro symptoms.

## 2019-06-23 NOTE — ED Provider Notes (Signed)
[facility] Emergency Department Provider Note  ____________________________________________   First MD Initiated Contact with Patient 06/23/19 1341     (approximate)  I have reviewed the triage vital signs and the nursing notes.   HISTORY  Chief Complaint Dizziness    HPI Derek Thornton is a 28 y.o. male who presents with fogginess.  Patient was seen on 10/21 due to migraine.  Patient is had migraines in the past but this was the first time that he had tingling sensation in his bilateral knees down.  Patient was given Fioricet with resolution of symptoms but said he has been having some fogginess that has been constant, moderate, nothing makes it better, nothing makes it worse.  He measured his blood pressure at home is 130/90 he was concerned about his blood pressure being elevated.  He says he has been under a lot of stress recently.  He had a nurse friend tell him that he should go to the ER to be reevaluated.          Past Medical History:  Diagnosis Date  . Childhood asthma   . Concussion   . Fatigue 2012   blood work unrevealing, neuro eval benign  . GERD (gastroesophageal reflux disease)    moderate by barium swallow 2010  . History of migraines   . Neuromuscular disorder ([facility])   . Palpitations   . Partial thickness burn of hand 2012   freon, R hand, treated with silvadene    Patient Active Problem List   Diagnosis Date Noted  . Chronic bilateral low back pain 06/14/2018  . Chronic bilateral thoracic back pain 06/14/2018  . Chronic neck pain 06/14/2018  . 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 GERD (gastroesophageal reflux disease)     Past Surgical History:  Procedure Laterality Date  . ESOPHAGOGASTRODUODENOSCOPY  10/10/2012   small HH, GERD, normal stomach and duodenum Randa Evens)    Prior to Admission medications   Medication Sig Start Date End Date Taking? Authorizing Provider  betamethasone dipropionate (DIPROLENE) 0.05 %  cream Apply topically 2 (two) times daily as needed. 03/23/18   Aliene Beams, MD  butalbital-acetaminophen-caffeine (FIORICET) [id number] MG tablet Take 1-2 tablets by mouth every 6 (six) hours as needed for headache. 06/19/19 06/18/20  Phineas Semen, MD    Allergies Patient has no known allergies.  Family History  Problem Relation Age of Onset  . Arthritis Mother   . Cancer Maternal Grandfather     Social History Social History   Tobacco Use  . Smoking status: Former Smoker    Years: 1.00    Types: Cigarettes    Quit date: 08/29/2010    Years since quitting: 8.8  . Smokeless tobacco: Never Used  Substance Use Topics  . Alcohol use: Not Currently    Alcohol/week: 0.0 standard drinks    Comment: occasional   . Drug use: No      Review of Systems Constitutional: No fever/chills Eyes: No visual changes. ENT: No sore throat. Cardiovascular: Denies chest pain. Respiratory: Denies shortness of breath. Gastrointestinal: No abdominal pain.  No nausea, no vomiting.  No diarrhea.  No constipation. Genitourinary: Negative for dysuria. Musculoskeletal: Negative for back pain. Skin: Negative for rash. Neurological: Headaches now resolved, focal weakness or numbness.  Fogginess All other ROS negative ____________________________________________   PHYSICAL EXAM:  VITAL SIGNS: ED Triage Vitals  Enc Vitals Group     BP 06/23/19 1225 134/80     Pulse Rate 06/23/19 1225 87  Resp 06/23/19 1225 16     Temp 06/23/19 1225 99.2 F (37.3 C)     Temp Source 06/23/19 1225 Oral     SpO2 06/23/19 1225 100 %     Weight 06/23/19 1231 150 lb (68 kg)     Height 06/23/19 1231 5\' 7"  (1.702 m)     Head Circumference --      Peak Flow --      Pain Score 06/23/19 1231 0     Pain Loc --      Pain Edu? --      Excl. in GC? --     Constitutional: Alert and oriented. Well appearing and in no acute distress. Eyes: Conjunctivae are normal. EOMI. Head: Atraumatic. Nose: No  congestion/rhinnorhea. Mouth/Throat: Mucous membranes are moist.   Neck: No stridor. Trachea Midline. FROM Cardiovascular: Normal rate, regular rhythm. Grossly normal heart sounds.  Good peripheral circulation. Respiratory: Normal respiratory effort.  No retractions. Lungs CTAB. Gastrointestinal: Soft and nontender. No distention. No abdominal bruits.  Musculoskeletal: No lower extremity tenderness nor edema.  No joint effusions. Neurologic:  Normal speech and language.  Cranial nerves II through XII are intact.  Equal strength in his arms and legs.  Able to ambulate well Skin:  Skin is warm, dry and intact. No rash noted. Psychiatric: Mood and affect are normal. Speech and behavior are normal. GU: Deferred   ____________________________________________    ED ECG REPORT I, Concha SeMary E Janise Gora, the attending physician, personally viewed and interpreted this ECG.  EKG is normal sinus rate of 70, no ST elevation, no T wave inversion, normal intervals ____________________________________________  RADIOLOGY   Official radiology report(s): Ct Head Wo Contrast  Result Date: 06/23/2019 CLINICAL DATA:  Migraine, dizziness, brain fog EXAM: CT HEAD WITHOUT CONTRAST TECHNIQUE: Contiguous axial images were obtained from the base of the skull through the vertex without intravenous contrast. COMPARISON:  12/01/2012 FINDINGS: Brain: No evidence of acute infarction, hemorrhage, hydrocephalus, extra-axial collection or mass lesion/mass effect. Vascular: No hyperdense vessel or unexpected calcification. Skull: Normal. Negative for fracture or focal lesion. Sinuses/Orbits: No acute finding. Other: None. IMPRESSION: No acute intracranial pathology. No non-contrast CT findings to explain headache or other symptoms. Electronically Signed   By: Lauralyn PrimesAlex  Bibbey M.D.   On: 06/23/2019 13:26    ____________________________________________   PROCEDURES  Procedure(s) performed (including Critical Care):  Procedures    ____________________________________________   INITIAL IMPRESSION / ASSESSMENT AND PLAN / ED COURSE  Derek Thornton was evaluated in Emergency Department on 06/23/2019 for the symptoms described in the history of present illness. He was evaluated in the context of the global COVID-19 pandemic, which necessitated consideration that the patient might be at risk for infection with the SARS-CoV-2 virus that causes COVID-19. Institutional protocols and algorithms that pertain to the evaluation of patients at risk for COVID-19 are in a state of rapid change based on information released by regulatory bodies including the CDC and federal and state organizations. These policies and algorithms were followed during the patient's care in the ED.    Patient presents with fogginess after getting treatment for migraine a few days ago.  CT head was ordered to rule out intracranial hemorrhage, tumor.  Patient was concerned that he may have had a stroke.  No evidence of stroke upon examination.  Patient's description of his headache does not sound like a subarachnoid hemorrhage and therefore there are more risk than benefits for lumbar puncture at this time.  His headache was not sudden in  onset and severity.  Will do EKG to make sure there is no evidence of arrhythmia.  Patient already had labs with normal sugar couple days ago so unlikely to be secondary to electrolyte abnormalities.   CT head was negative.  Patient feels comfortable with discharge home at this time  I discussed the provisional nature of ED diagnosis, the treatment so far, the ongoing plan of care, follow up appointments and return precautions with the patient and any family or support people present. They expressed understanding and agreed with the plan, discharged home.   ____________________________________________   FINAL CLINICAL IMPRESSION(S) / ED DIAGNOSES   Final diagnoses:  Other migraine without status migrainosus, intractable       MEDICATIONS GIVEN DURING THIS VISIT:  Medications - No data to display   ED Discharge Orders    None       Note:  This document was prepared using Dragon voice recognition software and may include unintentional dictation errors.   Vanessa Revloc, MD 06/23/19 [phone]

## 2019-06-23 NOTE — ED Notes (Signed)
Spoke to [facility] about pt presentation. Per MD, CT head is ordered, no other tests needed at this time.

## 2019-06-23 NOTE — Discharge Instructions (Signed)
Your work-up was reassuring including negative CT scan.  You should follow-up with neurology for your headaches.  Return to the ER for fevers or any other concerns

## 2019-06-23 NOTE — ED Notes (Signed)
Pt c/o of having a migraine that lasted about 3 days. Pt states has a hx of migraine but he hasn't had one in 6-7 years. Pt c/o headache, L arm numbness, visual changes, and "just feeling out of it". Denies SOB, CP, fever, or NVD

## 2019-06-26 ENCOUNTER — Ambulatory Visit: Payer: 59 | Admitting: Family Medicine

## 2019-08-06 ENCOUNTER — Telehealth: Payer: Self-pay | Admitting: *Deleted

## 2019-08-06 NOTE — Telephone Encounter (Signed)
Copied from [place] [id number]. Topic: General - Other >> Aug 06, 2019  8:33 AM Leward Quan A wrote: Reason for CRM: Patient wife called to get him an appointment for today due to some issues he is having. Patient was seen in the ED on 10/21/ and 06/23/2019 for migraine and some other issues. Per wife he is still having these problems and need to be seen right away. Please call patient at Ph# [phone]

## 2019-08-06 NOTE — Telephone Encounter (Signed)
Patient has neurologist advised patient if he  Has sen neurology for migraines he should be following up with neurology.  Patient agreed he will call neurology and he will call office if he need referral. Patient is not currently having active symptoms of migraine. Sees Neurology at [facility].

## 2019-08-06 NOTE — Telephone Encounter (Signed)
Noted. Agree that he needs to touch base with neurology.

## 2019-08-15 ENCOUNTER — Telehealth: Payer: Self-pay | Admitting: Family Medicine

## 2019-08-15 NOTE — Telephone Encounter (Signed)
Patient called today to schedule appointment for migraines that he has previously been treated for. He has been Seeing Philis Nettle but stated she is leaving the practice.  He would like to re est care with you. Last visit was in 2014. I was going to schedule with you but wanted to make sure that this was okay to schedule before doing so

## 2019-08-15 NOTE — Telephone Encounter (Signed)
Ok to schedule. Thanks. May place in open 30 min slot.

## 2019-08-21 ENCOUNTER — Encounter: Payer: Self-pay | Admitting: Family Medicine

## 2019-08-21 ENCOUNTER — Ambulatory Visit (INDEPENDENT_AMBULATORY_CARE_PROVIDER_SITE_OTHER): Payer: 59 | Admitting: Family Medicine

## 2019-08-21 ENCOUNTER — Other Ambulatory Visit: Payer: Self-pay

## 2019-08-21 VITALS — BP 116/64 | HR 93 | Temp 98.1°F | Ht 66.25 in | Wt 158.3 lb

## 2019-08-21 DIAGNOSIS — H547 Unspecified visual loss: Secondary | ICD-10-CM | POA: Diagnosis not present

## 2019-08-21 DIAGNOSIS — G43109 Migraine with aura, not intractable, without status migrainosus: Secondary | ICD-10-CM | POA: Diagnosis not present

## 2019-08-21 DIAGNOSIS — R5383 Other fatigue: Secondary | ICD-10-CM

## 2019-08-21 LAB — HEPATIC FUNCTION PANEL
ALT: 28 U/L (ref 0–53)
AST: 22 U/L (ref 0–37)
Albumin: 4.9 g/dL (ref 3.5–5.2)
Alkaline Phosphatase: 80 U/L (ref 39–117)
Bilirubin, Direct: 0.2 mg/dL (ref 0.0–0.3)
Total Bilirubin: 1.1 mg/dL (ref 0.2–1.2)
Total Protein: 7.1 g/dL (ref 6.0–8.3)

## 2019-08-21 LAB — VITAMIN D 25 HYDROXY (VIT D DEFICIENCY, FRACTURES): VITD: 49.49 ng/mL (ref 30.00–100.00)

## 2019-08-21 LAB — VITAMIN B12: Vitamin B-12: 500 pg/mL (ref 211–911)

## 2019-08-21 LAB — TSH: TSH: 0.85 u[IU]/mL (ref 0.35–4.50)

## 2019-08-21 LAB — SEDIMENTATION RATE: Sed Rate: 1 mm/hr (ref 0–15)

## 2019-08-21 NOTE — Assessment & Plan Note (Signed)
Noted on snellen today - failed L eye. Has upcoming eye doctor appt 08/2019.

## 2019-08-21 NOTE — Progress Notes (Signed)
This visit was conducted in person.  BP 116/64 (BP Location: Left Arm, Patient Position: Sitting, Cuff Size: Large)   Pulse 93   Temp 98.1 F (36.7 C) (Temporal)   Ht 5' 6.25" (1.683 m)   Wt 158 lb 5 oz (71.8 kg)   SpO2 97%   BMI 25.36 kg/m    Hearing Screening   125Hz  250Hz  500Hz  1000Hz  2000Hz  3000Hz  4000Hz  6000Hz  8000Hz   Right ear:           Left ear:             Visual Acuity Screening   Right eye Left eye Both eyes  Without correction: 20/25 20/50 20/30   With correction:        CC: re establish care Subjective:    Patient ID: Derek Thornton, male    DOB: 11/30/1990, 28 y.o.   MRN: [id number]  HPI: Derek RocksLogan C Cermak is a 28 y.o. male presenting on 08/21/2019 for Re-establish Care (Wants to discuss depression and migraines. )   I last saw patient 07/2013.  H/o childhood migraines with paresthesias as aura. No migraine trouble for 9-10 yrs.   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 Had associated migraine similar to prior - describes 7/10 dull ache starting in R shoulder blade radiating to R side of head, with photophobia, activity limiting. No phonophobia or nausea/vomiting. No rhinorrhea or watery eyes with this. Seen at ER with fioricet tx. Brain fog remained for 1.5 months.   ER visits x2 05/2019 for complicated migraines presenting with B lower extremity paresthesias below the knees as well as of hands, lips and tongue. ER records and labs reviewed. Head CT 06/23/2019 reviewed - reassuring.   Saw [facility] neurology Malvin Johns([place]) in follow up - started on effexor 75mg  bid for anxiety, rec fioricet or flexeril for abortive migraine therapy, also prescribed imitrex for aura. FMLA for migraines filled out by neurology.   Last migraine was 2 wks ago. He is working on stress relieving strategies with possible benefit (ie golfing).   He did not tolerate effexor -  racing thoughts, couldn't sleep.  He did not tolerate topamax - depersonalization. He stopped it 1 wk into course, after he stopped had episode of shaking, racing heart, shortness of breath ?related to stopping medication. High stress at home/work - son with asthma, new baby on the way, he is only one currently working at home.  Feels he's sleeping well and enough.   Pending eye exam.  Upcoming psych eval at end of month.  No recent tick bites. Had multiple tick bites 2017. Endorses intermittent fatigue for years.      Relevant past medical, surgical, family and social history reviewed and updated as indicated. Interim medical history since our last visit reviewed. Allergies and medications reviewed and updated. Outpatient Medications Prior to Visit  Medication Sig Dispense Refill  . betamethasone dipropionate (DIPROLENE) 0.05 % cream Apply topically 2 (two) times daily as needed. 30 g 1  . butalbital-acetaminophen-caffeine (FIORICET) 50-325-40 MG tablet Take 1-2 tablets by mouth every 6 (six) hours as needed for headache. 20 tablet 0  . Multiple Vitamin (MULTIVITAMIN) tablet Take 1 tablet by mouth daily.    . Omega-3 Fatty Acids (FISH OIL) 1000 MG CAPS Take 2 capsules by mouth daily.    . SUMAtriptan (IMITREX) 50 MG tablet Take 50 mg by mouth every 2 (  two) hours as needed for migraine. May repeat in 2 hours if headache persists or recurs.     No facility-administered medications prior to visit.     Per HPI unless specifically indicated in ROS section below Review of Systems Objective:    BP 116/64 (BP Location: Left Arm, Patient Position: Sitting, Cuff Size: Large)   Pulse 93   Temp 98.1 F (36.7 C) (Temporal)   Ht 5' 6.25" (1.683 m)   Wt 158 lb 5 oz (71.8 kg)   SpO2 97%   BMI 25.36 kg/m   Wt Readings from Last 3 Encounters:  08/21/19 158 lb 5 oz (71.8 kg)  06/23/19 150 lb (68 kg)  06/19/19 155 lb (70.3 kg)    Physical Exam Vitals and nursing note reviewed.    Constitutional:      Appearance: Normal appearance. He is not ill-appearing.  HENT:     Head: Normocephalic and atraumatic.  Eyes:     Extraocular Movements: Extraocular movements intact.     Conjunctiva/sclera: Conjunctivae normal.     Pupils: Pupils are equal, round, and reactive to light.  Cardiovascular:     Rate and Rhythm: Normal rate and regular rhythm.     Pulses: Normal pulses.     Heart sounds: Normal heart sounds. No murmur.  Pulmonary:     Effort: Pulmonary effort is normal. No respiratory distress.     Breath sounds: Normal breath sounds. No wheezing or rales.  Musculoskeletal:     Right lower leg: No edema.     Left lower leg: No edema.  Neurological:     General: No focal deficit present.     Mental Status: He is alert.     Cranial Nerves: Cranial nerves are intact.     Sensory: Sensation is intact.     Motor: Motor function is intact.     Coordination: Coordination is intact.     Gait: Gait is intact.     Comments:  CN 2-12 intact FTN intact EOMI Romberg negative, no pronator drift  Psychiatric:        Mood and Affect: Mood normal.        Behavior: Behavior normal.       Results for orders placed or performed during the hospital encounter of 06/19/19  Glucose, capillary  Result Value Ref Range   Glucose-Capillary 85 70 - 99 mg/dL   Comment 1 Notify RN    Comment 2 Document in Chart   CBC  Result Value Ref Range   WBC 10.5 4.0 - 10.5 K/uL   RBC 5.11 4.22 - 5.81 MIL/uL   Hemoglobin 15.2 13.0 - 17.0 g/dL   HCT 09.7 35.3 - 29.9 %   MCV 84.1 80.0 - 100.0 fL   MCH 29.7 26.0 - 34.0 pg   MCHC 35.3 30.0 - 36.0 g/dL   RDW 24.2 68.3 - 41.9 %   Platelets 305 150 - 400 K/uL   nRBC 0.0 0.0 - 0.2 %  Basic metabolic panel  Result Value Ref Range   Sodium 138 135 - 145 mmol/L   Potassium 3.6 3.5 - 5.1 mmol/L   Chloride 99 98 - 111 mmol/L   CO2 27 22 - 32 mmol/L   Glucose, Bld 118 (H) 70 - 99 mg/dL   BUN 14 6 - 20 mg/dL   Creatinine, Ser 6.22 0.61 - 1.24  mg/dL   Calcium 9.6 8.9 - 29.7 mg/dL   GFR calc non Af Amer >60 >60 mL/min   GFR calc  Af Amer >60 >60 mL/min   Anion gap 12 5 - 15   Depression screen [facility] 2/9 08/21/2019 06/14/2018 01/18/2017 07/22/2015  Decreased Interest 1 0 0 0  Down, Depressed, Hopeless 2 0 0 0  PHQ - 2 Score 3 0 0 0  Altered sleeping 0 - - -  Tired, decreased energy 1 - - -  Change in appetite 0 - - -  Feeling bad or failure about yourself  1 - - -  Trouble concentrating 2 - - -  Moving slowly or fidgety/restless 1 - - -  Suicidal thoughts 0 - - -  PHQ-9 Score 8 - - -    GAD 7 : Generalized Anxiety Score 08/21/2019  Nervous, Anxious, on Edge 1  Control/stop worrying 1  Worry too much - different things 1  Trouble relaxing 1  Restless 0  Easily annoyed or irritable 1  Afraid - awful might happen 2  Total GAD 7 Score 7   Assessment & Plan:  This visit occurred during the SARS-CoV-2 public health emergency.  Safety protocols were in place, including screening questions prior to the visit, additional usage of staff PPE, and extensive cleaning of exam room while observing appropriate contact time as indicated for disinfecting solutions.   Problem List Items Addressed This Visit    Decreased visual acuity    Noted on snellen today - failed L eye. Has upcoming eye doctor appt 08/2019.      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 He has not tolerated prophylactic medications at all well (venlafaxine and topiramate) - have added to allergy list. Doesn't feel imitrex has been all that effective. May be good candidate for gepant abortively. Actually, hasn't had a migraine in the last 2 weeks - suggested stay off ppx med at this time. Encouraged neuro f/u.  Failed snellen on left - eye strain may contribute to increased migraines - he has eye appt  scheduled 08/2019.  I will check for reversible causes of endorsed fatigue or other cause for worsening migraines.  I think current depressed mood is situational due to worsening migraines causing stress/anxiety, don't think he needs daily antidepressant medication at this time. He does have psychiatry appt upcoming.       Relevant Medications   SUMAtriptan (IMITREX) 50 MG tablet    Other Visit Diagnoses    Fatigue, unspecified type       Relevant Orders   LFTs   TSH   Vitamin B12   vit d   Sedimentation rate       No orders of the defined types were placed in this encounter.  Orders Placed This Encounter  Procedures  . LFTs  . TSH  . Vitamin B12  . vit d  . Sedimentation rate    Patient Instructions  Visual acuity test today.  Labs today.  Try imitrex 100mg  at next aura and if persistent, may repeat in 2 hours x 1. If this doesn't help, let me know or neurology know to change abortive migraine medicine.  Stay off daily migraine prevention medicine at this time.  Let me know how you do.   Follow up plan: Return if symptoms worsen or fail to improve.  Ria Bush, MD

## 2019-08-21 NOTE — Patient Instructions (Addendum)
Visual acuity test today.  Labs today.  Try imitrex 100mg  at next aura and if persistent, may repeat in 2 hours x 1. If this doesn't help, let me know or neurology know to change abortive migraine medicine.  Stay off daily migraine prevention medicine at this time.  Let me know how you do.

## 2019-08-21 NOTE — Assessment & Plan Note (Addendum)
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 He has not tolerated prophylactic medications at all well (venlafaxine and topiramate) - have added to allergy list. Doesn't feel imitrex has been all that effective. May be good candidate for gepant abortively. Actually, hasn't had a migraine in the last 2 weeks - suggested stay off ppx med at this time. Encouraged neuro f/u.  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  I will check for reversible causes of endorsed fatigue or other cause for worsening migraines.  I think current depressed mood is situational due to worsening migraines causing stress/anxiety, don't think he needs daily antidepressant medication at this time. He does have psychiatry appt upcoming.

## 2019-09-26 ENCOUNTER — Other Ambulatory Visit: Payer: Self-pay | Admitting: Acute Care

## 2019-09-26 DIAGNOSIS — G43709 Chronic migraine without aura, not intractable, without status migrainosus: Secondary | ICD-10-CM

## 2019-10-08 ENCOUNTER — Ambulatory Visit: Payer: 59

## 2019-10-15 ENCOUNTER — Ambulatory Visit: Payer: 59

## 2019-10-22 ENCOUNTER — Other Ambulatory Visit: Payer: Self-pay

## 2019-10-22 ENCOUNTER — Ambulatory Visit
Admission: RE | Admit: 2019-10-22 | Discharge: 2019-10-22 | Disposition: A | Discharge status: Home / Self Care | Payer: 59 | Source: Ambulatory Visit | Attending: Acute Care | Admitting: Acute Care

## 2019-10-22 DIAGNOSIS — G43709 Chronic migraine without aura, not intractable, without status migrainosus: Secondary | ICD-10-CM | POA: Diagnosis present

## 2020-04-28 ENCOUNTER — Other Ambulatory Visit: Payer: Self-pay | Admitting: Family Medicine

## 2020-04-28 DIAGNOSIS — L309 Dermatitis, unspecified: Secondary | ICD-10-CM

## 2020-04-29 NOTE — Telephone Encounter (Signed)
Betamethasone cream Last filled:  01/22/19, #30 g Last OV:  08/21/19, re-establish care Next OV:  none

## 2020-09-13 IMAGING — MR MR HEAD W/O CM
12 series · 48 of 48 positions shown · non-contrast
Comparison: Head CT June 23, 2019

CLINICAL DATA: Chronic migraine

EXAM:
MRI HEAD WITHOUT CONTRAST
TECHNIQUE: Multiplanar, multiecho pulse sequences of the brain and surrounding
structures were obtained without intravenous contrast.

[Series 5: ax dwi_tracew · axial · 3.0mm · 0.60mm/px · z∈[-104,+50]mm · 3 of 48 slices shown]
[im 1/48]
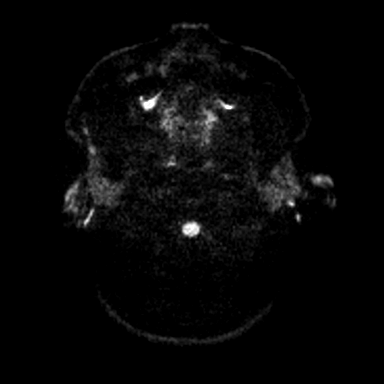
[im 24/48]
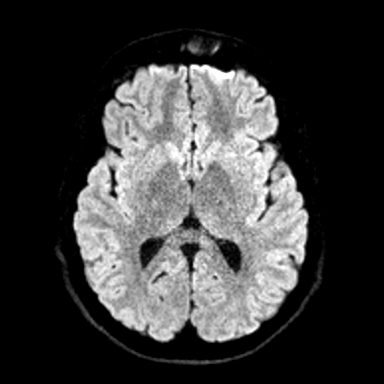
[im 48/48]
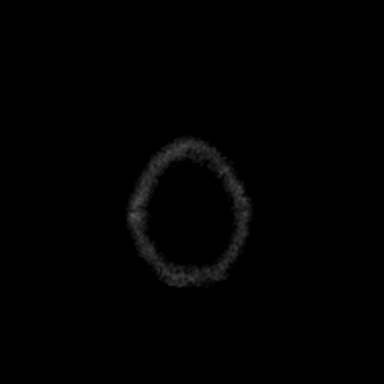

[Series 6: ax dwi_adc · axial · 3.0mm · 0.60mm/px · z∈[-104,+50]mm · 4 of 48 slices shown]
[im 1/48]
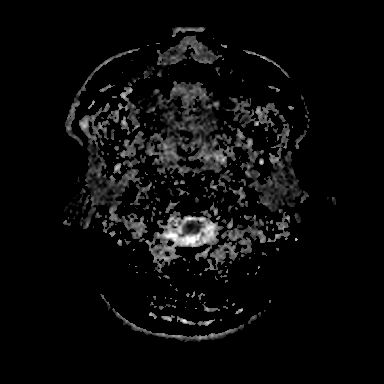
[im 16/48]
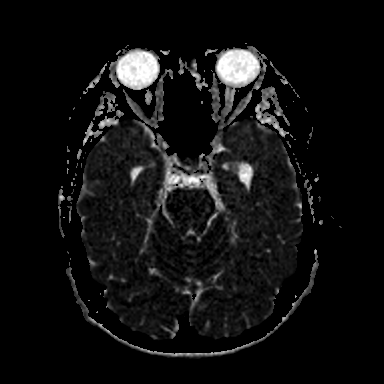
[im 32/48]
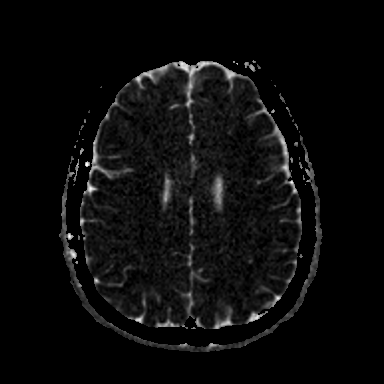
[im 48/48]
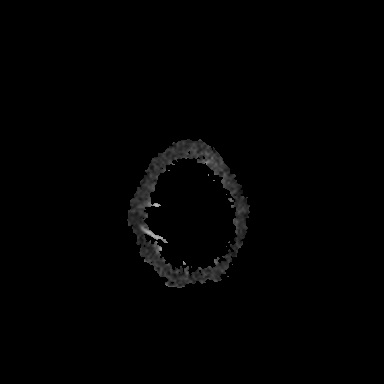

[Series 7: cor dwi_tracew · coronal · 5.0mm · 0.60mm/px · 3 of 38 slices shown]
[im 1/38]
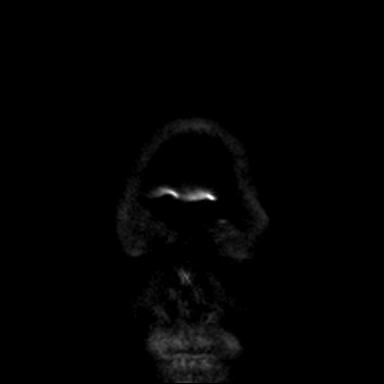
[im 19/38]
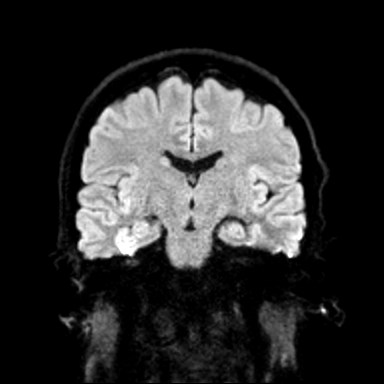
[im 38/38]
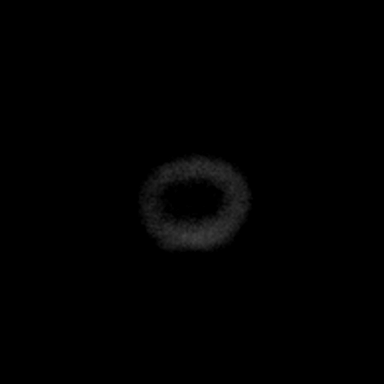

[Series 8: cor dwi_adc · coronal · 5.0mm · 0.60mm/px · 3 of 38 slices shown]
[im 1/38]
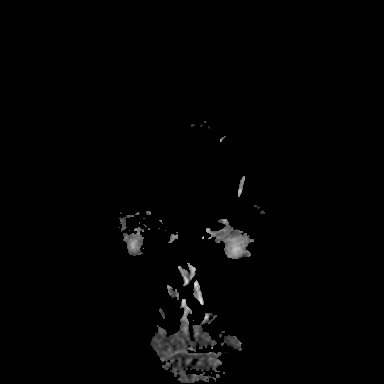
[im 19/38]
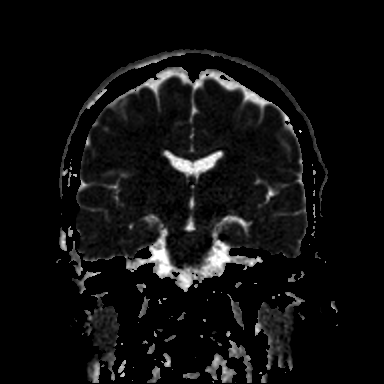
[im 38/38]
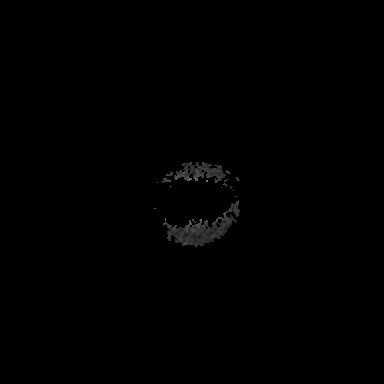

[Series 9: T1 · sagittal · 5.0mm · 0.62mm/px · 2 of 23 slices shown (1 of 2)]
[im 1/23]
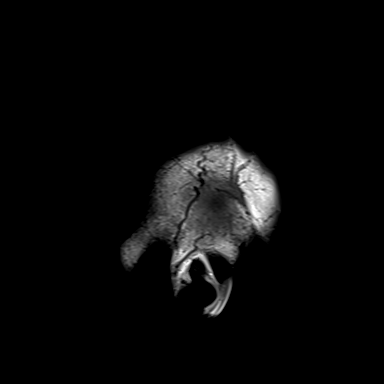
[im 23/23]
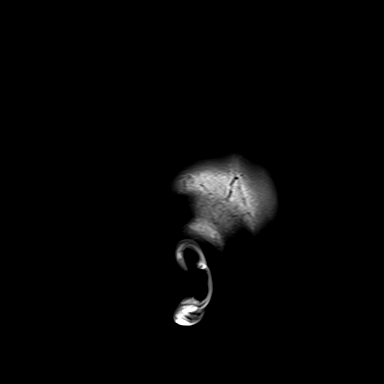

[Series 10: T2 · axial · 5.0mm · 0.53mm/px · z∈[-104,+52]mm · 2 of 27 slices shown (1 of 2)]
[im 1/27]
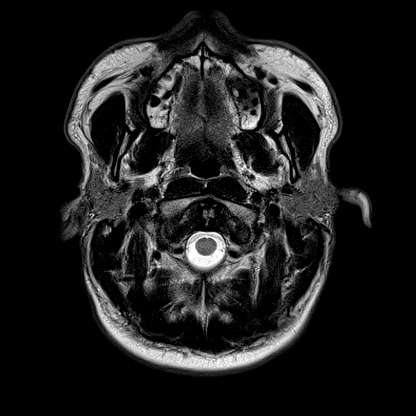
[im 27/27]
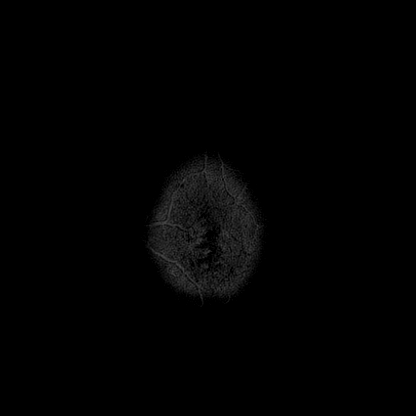

[Series 11: mag_images · axial · 3.0mm · 0.90mm/px · z∈[-114,+62]mm · 4 of 60 slices shown]
[im 1/60]
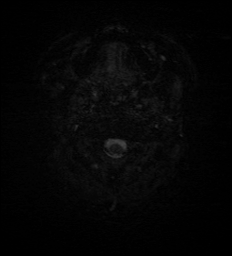
[im 20/60]
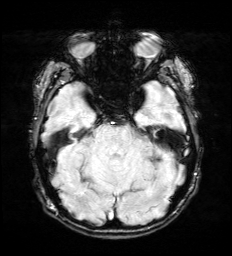
[im 40/60]
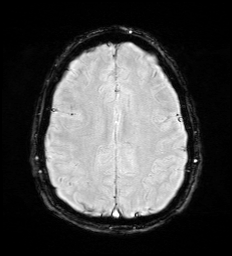
[im 60/60]
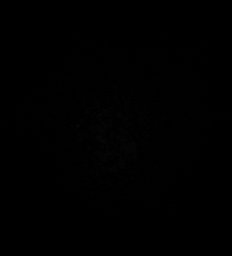

[Series 12: pha_images · axial · 3.0mm · 0.90mm/px · z∈[-114,+59]mm · 4 of 59 slices shown]
[im 1/59]
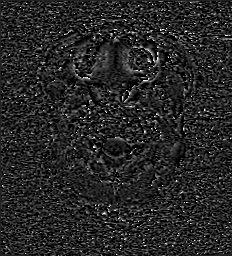
[im 20/59]
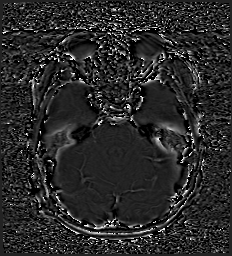
[im 39/59]
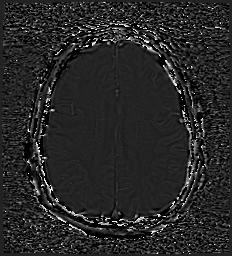
[im 59/59]
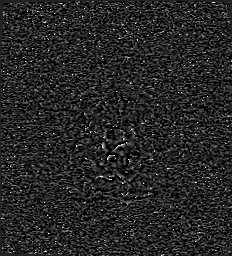

[Series 13: swi_images · axial · 3.0mm · 0.90mm/px · z∈[-114,+62]mm · 4 of 60 slices shown]
[im 1/60]
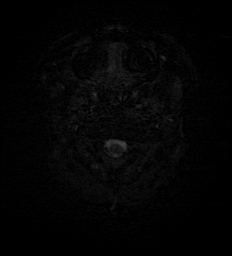
[im 20/60]
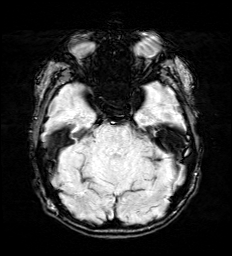
[im 40/60]
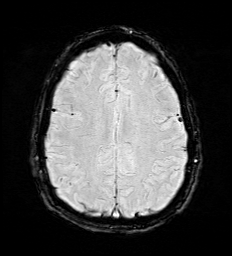
[im 60/60]
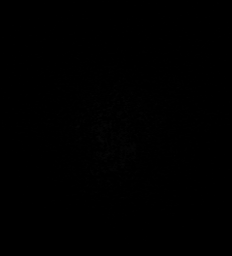

[Series 15: FLAIR · axial · 3.0mm · 0.53mm/px · z∈[-106,+55]mm · 4 of 55 slices shown]
[im 1/55]
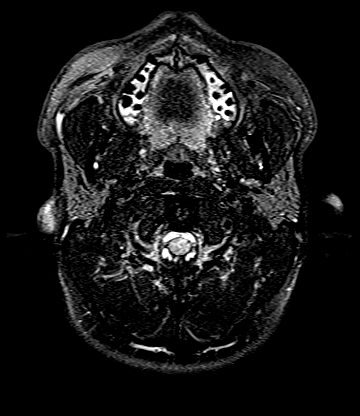
[im 19/55]
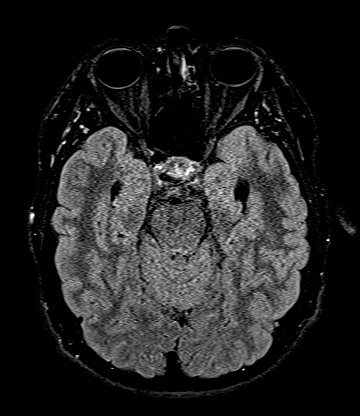
[im 37/55]
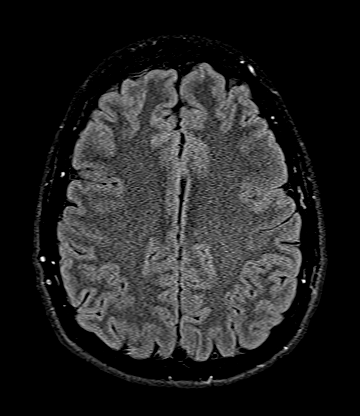
[im 55/55]
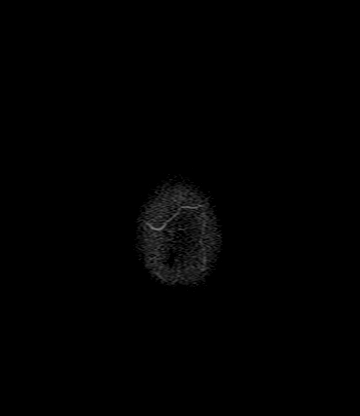

[Series 16: T1 · axial · 1.0mm · 0.98mm/px · z∈[-101,+69]mm · 13 of 171 slices shown (2 of 2)]
[im 1/171]
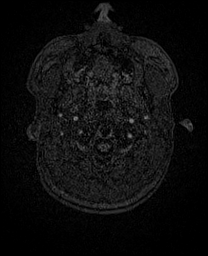
[im 15/171]
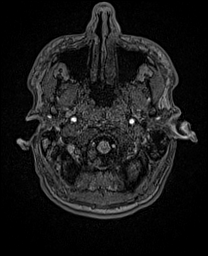
[im 29/171]
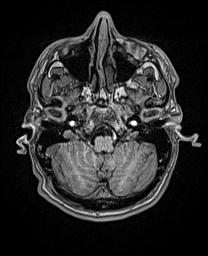
[im 43/171]
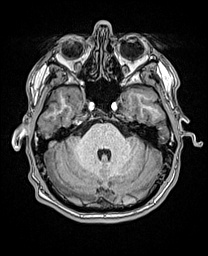
[im 57/171]
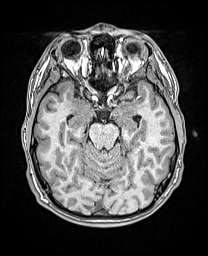
[im 71/171]
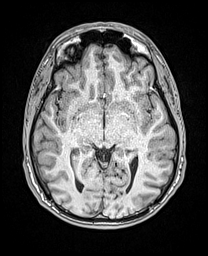
[im 86/171]
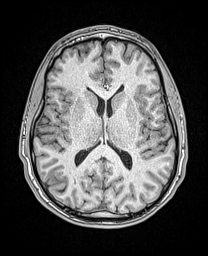
[im 100/171]
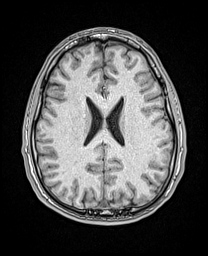
[im 114/171]
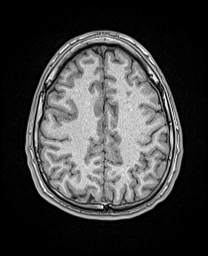
[im 128/171]
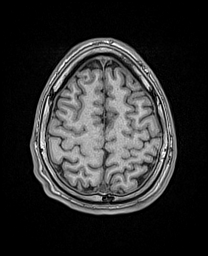
[im 142/171]
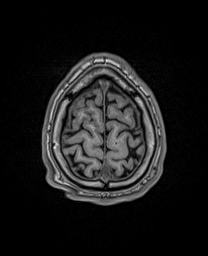
[im 156/171]
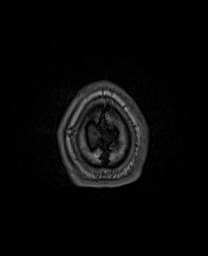
[im 171/171]
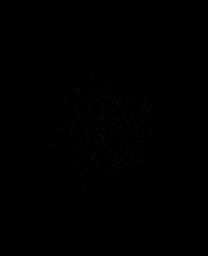

[Series 17: T2 · coronal · 5.0mm · 0.57mm/px · 2 of 29 slices shown (2 of 2)]
[im 1/29]
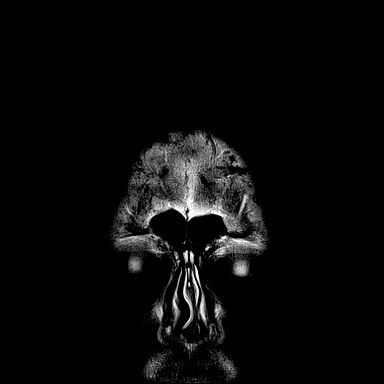
[im 29/29]
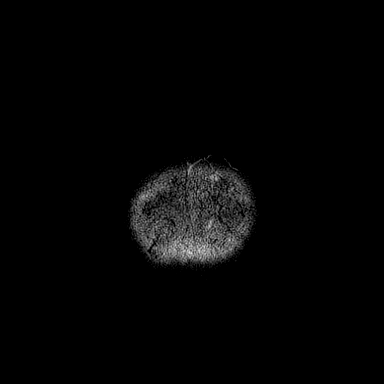

[48 of 48 positions shown; findings below may reference images not displayed]

FINDINGS: Brain: No acute infarction, hemorrhage, hydrocephalus, extra-axial
collection or mass lesion. The brain parenchyma has normal
morphology and signal characteristics.

Vascular: Normal flow voids.

Skull and upper cervical spine: Normal marrow signal.

Sinuses/Orbits: Negative.

Other: None.
IMPRESSION: Normal MRI of the brain.  A

## 2021-07-13 ENCOUNTER — Other Ambulatory Visit: Payer: Self-pay

## 2021-07-13 ENCOUNTER — Ambulatory Visit (INDEPENDENT_AMBULATORY_CARE_PROVIDER_SITE_OTHER): Payer: 59 | Admitting: Podiatry

## 2021-07-13 ENCOUNTER — Encounter: Payer: Self-pay | Admitting: Podiatry

## 2021-07-13 DIAGNOSIS — Q666 Other congenital valgus deformities of feet: Secondary | ICD-10-CM | POA: Diagnosis not present

## 2021-07-13 DIAGNOSIS — M722 Plantar fascial fibromatosis: Secondary | ICD-10-CM | POA: Diagnosis not present

## 2021-07-14 ENCOUNTER — Telehealth: Payer: Self-pay | Admitting: *Deleted

## 2021-07-14 NOTE — Telephone Encounter (Signed)
"  I was seen by Dr. Allena Katz yesterday regarding my foot.  He had given me a note for my work.  I need some clarification on the note.  My job is requesting some clarification.  It had mentioned light duty.  I just want to make you aware that I'm clear to work until I have my surgery lined up.  Give me a call back."

## 2021-07-15 ENCOUNTER — Encounter: Payer: Self-pay | Admitting: Podiatry

## 2021-07-15 NOTE — Progress Notes (Signed)
Subjective:  Patient ID: Derek Thornton, male    DOB: 1991/07/28,  MRN: [id number]  Chief Complaint  Patient presents with   Foot Pain    Wants a 2nd opinion     30 y.o. male presents with the above complaint.  Patient presents with complaint of left plantar fibroma/soft tissue mass.  Patient states that is a lump on the middle of the foot.  Is been causing him some pain.  Is mild to moderate in nature.  He wanted to discuss treatment options for this.  He is here for second opinion he went to [facility] which told him he was fibromatosis and did not do anything else for him.  He denies any other acute complaints.  He also does not wear any orthotics and is constantly on his foot with work boots.   Review of Systems: Negative except as noted in the HPI. Denies N/V/F/Ch.  Past Medical History:  Diagnosis Date   Childhood asthma    Concussion    Fatigue 2012   blood work unrevealing, neuro eval benign   GERD (gastroesophageal reflux disease)    moderate by barium swallow 2010   History of migraines    Neuromuscular disorder (HCC)    Palpitations    Partial thickness burn of hand 2012   freon, R hand, treated with silvadene    Current Outpatient Medications:    betamethasone dipropionate 0.05 % cream, APPLY TOPICALLY 2 (TWO) TIMES DAILY AS NEEDED., Disp: 30 g, Rfl: 1   Multiple Vitamin (MULTIVITAMIN) tablet, Take 1 tablet by mouth daily., Disp: , Rfl:    Omega-3 Fatty Acids (FISH OIL) 1000 MG CAPS, Take 2 capsules by mouth daily., Disp: , Rfl:    SUMAtriptan (IMITREX) 50 MG tablet, Take 50 mg by mouth every 2 (two) hours as needed for migraine. May repeat in 2 hours if headache persists or recurs., Disp: , Rfl:   Social History   Tobacco Use  Smoking Status Former   Years: 1.00   Types: Cigarettes   Quit date: 08/29/2010   Years since quitting: 10.8  Smokeless Tobacco Never    Allergies  Allergen Reactions   Effexor [Venlafaxine] Other (See Comments)    Caused depression,  racing thoughts, trouble sleeping   Topamax [Topiramate] Other (See Comments)    Depersonalization and racing heart, tremor   Objective:  There were no vitals filed for this visit. There is no height or weight on file to calculate BMI. Constitutional Well developed. Well nourished.  Vascular Dorsalis pedis pulses palpable bilaterally. Posterior tibial pulses palpable bilaterally. Capillary refill normal to all digits.  No cyanosis or clubbing noted. Pedal hair growth normal.  Neurologic Normal speech. Oriented to person, place, and time. Epicritic sensation to light touch grossly present bilaterally.  Dermatologic Single lobulated firm nodule noted to left plantar fibroma/plantar fascial band.  Pain on palpation to the soft tissue mass.  No fluctuance noted.  No signs of ganglion cyst noted.  Orthopedic: Normal joint ROM without pain or crepitus bilaterally. No visible deformities. No bony tenderness.   Radiographs: None Assessment:   1. Pes planovalgus   2. Plantar fibromatosis    Plan:  Patient was evaluated and treated and all questions answered.  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  I discussed with the patient that sometimes a tight plantar fascial band can lead to microtrauma and tearing leading to plantar fibroma  and therefore the pain.  I discussed with him ultimately surgical removal will be the best way to completely excise it out however given that this is very mild to moderate in nature I believe patient will benefit from orthotics.  If it continues to hurt him I have asked him to come see me and we can discuss treatment options to have it removed.  He states understanding  Pes planovalgus -I explained to patient the etiology of pes planovalgus and relationship with plantar fibroma and various treatment options were discussed.  Given patient foot  structure in the setting of Planter fasciitis I believe patient will benefit from custom-made orthotics to help control the hindfoot motion support the arch of the foot and take the stress away from plantar fascial.  Patient agrees with the plan like to proceed with orthotics -Patient was casted for orthotics   No follow-ups on file.

## 2021-08-13 ENCOUNTER — Other Ambulatory Visit: Payer: Self-pay

## 2021-08-13 ENCOUNTER — Ambulatory Visit: Payer: 59

## 2021-08-13 DIAGNOSIS — Q666 Other congenital valgus deformities of feet: Secondary | ICD-10-CM

## 2021-08-13 NOTE — Progress Notes (Signed)
SITUATION: °Reason for Visit: Fitting and Delivery of Custom Fabricated Foot Orthoses °Patient Report: Patient reports comfort and is satisfied with device. ° °OBJECTIVE DATA: °Patient History / Diagnosis:  No change in pathology °Provided Device:  Functional foot orthotics ° °GOAL OF ORTHOSIS °- Improve gait °- Decrease energy expenditure °- Improve Balance °- Provide Triplanar stability of foot complex °- Facilitate motion ° °ACTIONS PERFORMED °Patient was fit with foot orthotics trimmed to shoe last. Patient tolerated fittign procedure. Device was modified as follows to better fit patient: °- Toe plate was trimmed to shoe last ° °Patient was provided with verbal and written instruction and demonstration regarding donning, doffing, wear, care, proper fit, function, purpose, cleaning, and use of the orthosis and in all related precautions and risks and benefits regarding the orthosis. ° °Patient was also provided with verbal instruction regarding how to report any failures or malfunctions of the orthosis and necessary follow up care. Patient was also instructed to contact our office regarding any change in status that may affect the function of the orthosis. ° °Patient demonstrated independence with proper donning, doffing, and fit and verbalized understanding of all instructions. ° °PLAN: °Patient is to follow up in one week or as necessary (PRN). All questions were answered and concerns addressed. Plan of care was discussed with and agreed upon by the patient. ° °

## 2021-10-09 ENCOUNTER — Other Ambulatory Visit: Payer: Self-pay | Admitting: Family Medicine

## 2021-10-09 DIAGNOSIS — L309 Dermatitis, unspecified: Secondary | ICD-10-CM

## 2021-10-15 ENCOUNTER — Ambulatory Visit: Payer: 59 | Admitting: Nurse Practitioner

## 2021-10-22 ENCOUNTER — Other Ambulatory Visit: Payer: Self-pay

## 2021-10-22 ENCOUNTER — Ambulatory Visit (INDEPENDENT_AMBULATORY_CARE_PROVIDER_SITE_OTHER): Payer: 59 | Admitting: Nurse Practitioner

## 2021-10-22 DIAGNOSIS — L309 Dermatitis, unspecified: Secondary | ICD-10-CM

## 2021-10-22 MED ORDER — BETAMETHASONE DIPROPIONATE 0.05 % EX CREA: TOPICAL_CREAM | Freq: Two times a day (BID) | CUTANEOUS | 0 refills | Status: AC | PRN

## 2021-10-22 NOTE — Assessment & Plan Note (Signed)
Patient has a history of the same.  Patient coming in just to get refill of cream that he uses intermittently throughout the year.  We will refill cream without refill.  Did encourage patient to follow-up with PCP for physical.  Also discussed precautions in regards to using steroid cream inclusive of skin atrophy and color changing of the skin.  Patient acknowledged follow-up if symptoms do not improve

## 2021-10-22 NOTE — Patient Instructions (Signed)
Nice to see you today. Schedule a follow up with Dr. Sharen Hones within the next 3 months for a physical that way if you need a refill he can refill it for you Follow up if symptoms do not improve or they get worse Try and limit use to 7 days in a row.

## 2021-10-22 NOTE — Progress Notes (Signed)
Acute Office Visit  Subjective:    Patient ID: Derek Thornton, male    DOB: [birth year], 31 y.o.   MRN: [id number]  Chief Complaint  Patient presents with   Needs refill for dermatitis cream    Uses for skin irritation/flare up of his feet.    HPI Patient is in today for Medication refill  States that he has a history of dermatitis. To bilateral feet. 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  States that he will use it as needed through out the year. States it appears more so in the summers. States that his feet can get moist and he has tried different methods to remedy this. Has had the cream in the past but has since run out as he has not been seen in clinic in 2 years  Past Medical History:  Diagnosis Date   Childhood asthma    Concussion    Fatigue 2012   blood work unrevealing, neuro eval benign   GERD (gastroesophageal reflux disease)    moderate by barium swallow 2010   History of migraines    Neuromuscular disorder (HCC)    Palpitations    Partial thickness burn of hand 2012   freon, R hand, treated with silvadene    Past Surgical History:  Procedure Laterality Date   ESOPHAGOGASTRODUODENOSCOPY  10/10/2012   small HH, GERD, normal stomach and duodenum Randa Evens)    Family History  Problem Relation Age of Onset   Arthritis Mother    Cancer Maternal Grandfather     Social History   Socioeconomic History   Marital status: Married    Spouse name: Not on file   Number of children: Not on file   Years of education: Not on file   Highest education level: Not on file  Occupational History   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, commercial)  Tobacco Use   Smoking status: Former    Years: 1.00    Types: Cigarettes    Quit date: 08/29/2010    Years since quitting: 11.1   Smokeless tobacco: Never  Vaping Use   Vaping Use: Never used  Substance and Sexual Activity   Alcohol use: Not Currently    Alcohol/week: 0.0 standard drinks    Comment: occasional    Drug use: No   Sexual  activity: Yes  Other Topics Concern   Not on file  Social History Narrative   Caffeine: 2 sodas/day      Lives with parents, 2 dogs and cat      Brother died 10/28/08      Training for mixed martial arts and fighting      HS education   Social Determinants of Health   Financial Resource Strain: Not on file  Food Insecurity: Not on file  Transportation Needs: Not on file  Physical Activity: Not on file  Stress: Not on file  Social Connections: Not on file  Intimate Partner Violence: Not on file    Outpatient Medications Prior to Visit  Medication Sig Dispense Refill   aspirin-acetaminophen-caffeine (EXCEDRIN MIGRAINE) 250-250-65 MG tablet Take by mouth every 6 (six) hours as needed for headache.     betamethasone dipropionate 0.05 % cream APPLY TOPICALLY 2 (TWO) TIMES DAILY AS NEEDED. 30 g 1   Multiple Vitamin (MULTIVITAMIN) tablet Take 1 tablet by mouth daily.     Omega-3 Fatty Acids (FISH OIL) 1000 MG CAPS Take 2 capsules by mouth daily.     SUMAtriptan (IMITREX) 50 MG tablet Take 50 mg by  mouth every 2 (two) hours as needed for migraine. May repeat in 2 hours if headache persists or recurs. (Patient not taking: Reported on 10/22/2021)     No facility-administered medications prior to visit.    Allergies  Allergen Reactions   Effexor [Venlafaxine] Other (See Comments)    Caused depression, racing thoughts, trouble sleeping   Topamax [Topiramate] Other (See Comments)    Depersonalization and racing heart, tremor    Review of Systems  Constitutional:  Negative for chills and fever.  Skin:  Positive for color change and rash.       "+" pruritis      Objective:    Physical Exam Vitals and nursing note reviewed.  Constitutional:      Appearance: Normal appearance.  Cardiovascular:     Rate and Rhythm: Normal rate and regular rhythm.     Pulses: Normal pulses.     Heart sounds: Normal heart sounds.  Pulmonary:     Effort: Pulmonary effort is normal.     Breath  sounds: Normal breath sounds.  Musculoskeletal:       Feet:  Skin:    Findings: Erythema and rash present.     Comments: 50 cent piece size to right posterior medial ankle.  Does have smaller scattered spots to bilateral dorsal surfaces of feet  Neurological:     Mental Status: He is alert.    BP 108/60    Pulse 72    Temp 97.9 F (36.6 C)    Resp 10    Ht 5' 6.25" (1.683 m)    Wt 163 lb (73.9 kg)    SpO2 96%    BMI 26.11 kg/m  Wt Readings from Last 3 Encounters:  10/22/21 163 lb (73.9 kg)  08/21/19 158 lb 5 oz (71.8 kg)  06/23/19 150 lb (68 kg)    Health Maintenance Due  Topic Date Due   COVID-19 Vaccine (1) Never done   HIV Screening  Never done    There are no preventive care reminders to display for this patient.   Lab Results  Component Value Date   TSH 0.85 08/21/2019   Lab Results  Component Value Date   WBC 10.5 06/19/2019   HGB 15.2 06/19/2019   HCT 43.0 06/19/2019   MCV 84.1 06/19/2019   PLT 305 06/19/2019   Lab Results  Component Value Date   NA 138 06/19/2019   K 3.6 06/19/2019   CO2 27 06/19/2019   GLUCOSE 118 (H) 06/19/2019   BUN 14 06/19/2019   CREATININE 0.87 06/19/2019   BILITOT 1.1 08/21/2019   ALKPHOS 80 08/21/2019   AST 22 08/21/2019   ALT 28 08/21/2019   PROT 7.1 08/21/2019   ALBUMIN 4.9 08/21/2019   CALCIUM 9.6 06/19/2019   ANIONGAP 12 06/19/2019   GFR 94.03 06/14/2018   Lab Results  Component Value Date   CHOL 152 10/18/2010   Lab Results  Component Value Date   HDL 53.30 10/18/2010   Lab Results  Component Value Date   LDLCALC 85 10/18/2010   Lab Results  Component Value Date   TRIG 70.0 10/18/2010   Lab Results  Component Value Date   CHOLHDL 3 10/18/2010   No results found for: HGBA1C     Assessment & Plan:   Problem List Items Addressed This Visit       Musculoskeletal and Integument   Dermatitis    Patient has a history of the same.  Patient coming in just to get refill of  cream that he uses  intermittently throughout the year.  We will refill cream without refill.  Did encourage patient to follow-up with PCP for physical.  Also discussed precautions in regards to using steroid cream inclusive of skin atrophy and color changing of the skin.  Patient acknowledged follow-up if symptoms do not improve      Relevant Medications   betamethasone dipropionate 0.05 % cream     Meds ordered this encounter  Medications   betamethasone dipropionate 0.05 % cream    Sig: Apply topically 2 (two) times daily as needed.    Dispense:  30 g    Refill:  0    DX Code Needed  .   This visit occurred during the SARS-CoV-2 public health emergency.  Safety protocols were in place, including screening questions prior to the visit, additional usage of staff PPE, and extensive cleaning of exam room while observing appropriate contact time as indicated for disinfecting solutions.   Audria Nine, NP

## 2022-01-31 ENCOUNTER — Encounter: Payer: 59 | Admitting: Family Medicine

## 2023-05-17 ENCOUNTER — Encounter: Payer: Self-pay | Admitting: Family Medicine

## 2023-05-17 ENCOUNTER — Ambulatory Visit (INDEPENDENT_AMBULATORY_CARE_PROVIDER_SITE_OTHER): Payer: 59 | Admitting: Family Medicine

## 2023-05-17 VITALS — BP 122/80 | HR 99 | Temp 98.2°F | Wt 163.0 lb

## 2023-05-17 DIAGNOSIS — Z23 Encounter for immunization: Secondary | ICD-10-CM

## 2023-05-17 DIAGNOSIS — G43109 Migraine with aura, not intractable, without status migrainosus: Secondary | ICD-10-CM

## 2023-05-17 DIAGNOSIS — L989 Disorder of the skin and subcutaneous tissue, unspecified: Secondary | ICD-10-CM

## 2023-05-17 DIAGNOSIS — M6281 Muscle weakness (generalized): Secondary | ICD-10-CM

## 2023-05-17 DIAGNOSIS — Z13220 Encounter for screening for lipoid disorders: Secondary | ICD-10-CM

## 2023-05-17 DIAGNOSIS — Z131 Encounter for screening for diabetes mellitus: Secondary | ICD-10-CM

## 2023-05-17 NOTE — Patient Instructions (Addendum)
Flu shot today  Return at your convenience for fasting labs.  We will refer you for dermatology eval Recent weakness episode likely related to viral infection, ?COVID.

## 2023-05-17 NOTE — Progress Notes (Signed)
Ph: [phone] Fax: [phone]   Patient ID: Derek Thornton, male    DOB: 08-May-1991, 32 y.o.   MRN: [id number]  This visit was conducted in person.  BP 122/80   Pulse 99   Temp 98.2 F (36.8 C)   Wt 163 lb (73.9 kg)   SpO2 100%   BMI 26.11 kg/m    CC: myalgia Subjective:   HPI: Derek Thornton is a 32 y.o. male presenting on 05/17/2023 for Muscle Pain (Started last Monday; Legs and arms. Pt states things are doing better now, did not take anything for the pain, just went away on it's own. )   I last saw patient 2020, seen intermittently in office since then.   10d ago developed marked fatigue to extremities, also had sore throat at the time and slight cough. Over 2 days symptoms did resolve. Today feeling markedly better.   No fevers/chills, nausea, nasal congestion, HA, body aches, abd pain, diarrhea, loss of appetite. No one sided numbness, slurred speech, dizziness.  Wife and son had also felt ill around this time.  He has been around COVID but no known recent exposures.  H/o childhood asthma but none in adult.  Ex smoker - quit 10+ yrs ago.  Not fasting today.   Known h/o complex migraines followed by neurology, latest brain MRI reassuring 2021, managed with PRN excedrin/sumatriptan.  Currently with R sided headaches that start in R shoulder but no recent migraine. Manages these with excedrin.  Didn't tolerate daily topamax or effexor.  Had neurocognitive eval, unknown results. Brain fog has since resolved.   Working as Copywriter, advertising for [employer].      Relevant past medical, surgical, family and social history reviewed and updated as indicated. Interim medical history since our last visit reviewed. Allergies and medications reviewed and updated. Outpatient Medications Prior to Visit  Medication Sig Dispense Refill   aspirin-acetaminophen-caffeine (EXCEDRIN MIGRAINE) 250-250-65 MG tablet Take by mouth every 6 (six) hours as needed for headache.     Multiple Vitamin  (MULTIVITAMIN) tablet Take 1 tablet by mouth daily.     Omega-3 Fatty Acids (FISH OIL) 1000 MG CAPS Take 2 capsules by mouth daily.     betamethasone dipropionate 0.05 % cream Apply topically 2 (two) times daily as needed. (Patient not taking: Reported on 05/17/2023) 30 g 0   SUMAtriptan (IMITREX) 50 MG tablet Take 50 mg by mouth every 2 (two) hours as needed for migraine. May repeat in 2 hours if headache persists or recurs. (Patient not taking: Reported on 10/22/2021)     No facility-administered medications prior to visit.     Per HPI unless specifically indicated in ROS section below Review of Systems  Objective:  BP 122/80   Pulse 99   Temp 98.2 F (36.8 C)   Wt 163 lb (73.9 kg)   SpO2 100%   BMI 26.11 kg/m   Wt Readings from Last 3 Encounters:  05/17/23 163 lb (73.9 kg)  10/22/21 163 lb (73.9 kg)  08/21/19 158 lb 5 oz (71.8 kg)      Physical Exam Vitals and nursing note reviewed.  Constitutional:      Appearance: Normal appearance. He is not ill-appearing.  HENT:     Head: Normocephalic and atraumatic.     Right Ear: Tympanic membrane, ear canal and external ear normal. There is no impacted cerumen.     Left Ear: Tympanic membrane, ear canal and external ear normal. There is no impacted cerumen.  Nose: Nose normal.     Mouth/Throat:     Mouth: Mucous membranes are moist.     Pharynx: Oropharynx is clear. No oropharyngeal exudate or posterior oropharyngeal erythema.  Eyes:     Extraocular Movements: Extraocular movements intact.     Conjunctiva/sclera: Conjunctivae normal.     Pupils: Pupils are equal, round, and reactive to light.  Cardiovascular:     Rate and Rhythm: Normal rate and regular rhythm.     Pulses: Normal pulses.     Heart sounds: Normal heart sounds. No murmur heard. Pulmonary:     Effort: Pulmonary effort is normal. No respiratory distress.     Breath sounds: Normal breath sounds. No wheezing, rhonchi or rales.  Abdominal:     General: Bowel  sounds are normal. There is no distension.     Palpations: Abdomen is soft. There is no mass.     Tenderness: There is no abdominal tenderness. There is no guarding or rebound.     Hernia: No hernia is present.  Musculoskeletal:     Cervical back: Normal range of motion and neck supple.     Right lower leg: No edema.     Left lower leg: No edema.  Lymphadenopathy:     Cervical: No cervical adenopathy.  Skin:    General: Skin is warm and dry.     Findings: Lesion present. No rash.          Comments: To left inguinal region  Neurological:     General: No focal deficit present.     Mental Status: He is alert. Mental status is at baseline.     Sensory: Sensation is intact.     Motor: Motor function is intact.     Coordination: Coordination is intact.     Gait: Gait is intact.     Comments:  5/5 strength BUE, BLE Grip strength intact  Psychiatric:        Mood and Affect: Mood normal.        Behavior: Behavior normal.       Results for orders placed or performed in visit on 08/21/19  LFTs  Result Value Ref Range   Total Bilirubin 1.1 0.2 - 1.2 mg/dL   Bilirubin, Direct 0.2 0.0 - 0.3 mg/dL   Alkaline Phosphatase 80 39 - 117 U/L   AST 22 0 - 37 U/L   ALT 28 0 - 53 U/L   Total Protein 7.1 6.0 - 8.3 g/dL   Albumin 4.9 3.5 - 5.2 g/dL  TSH  Result Value Ref Range   TSH 0.85 0.35 - 4.50 uIU/mL  Vitamin B12  Result Value Ref Range   Vitamin B-12 500 211 - 911 pg/mL  vit d  Result Value Ref Range   VITD 49.49 30.00 - 100.00 ng/mL  Sedimentation rate  Result Value Ref Range   Sed Rate 1 0 - 15 mm/hr    Assessment & Plan:   Problem List Items Addressed This Visit     Complicated migraine    Has seen neurology.  Stable period with PRN imitrex/excedrin migraine abortively.       Skin lesion    Skin lesion to right inguinal region present for 6 yrs, unchanging. Anticipate benign mole.  He would desire definitive treatment, aware may not be covered by insurance.  Refer  to dermatology      Muscle weakness - Primary    Isolated episode of extremity weakness/fatigue associated with sore throat.  Benign exam today.  ?COVID  vs other viral induced myopathy, of short duration, symptoms now fully better. Update if recurrent or persistent symptoms for further evaluation.       Relevant Orders   TSH   Comprehensive metabolic panel   CBC with Differential/Platelet   Other Visit Diagnoses     Diabetes mellitus screening       Relevant Orders   Comprehensive metabolic panel   Encounter for lipid screening for cardiovascular disease       Relevant Orders   Lipid panel   Need for influenza vaccination       Relevant Orders   Flu vaccine trivalent PF, 6mos and older(Flulaval,Afluria,Fluarix,Fluzone) (Completed)        No orders of the defined types were placed in this encounter.   Orders Placed This Encounter  Procedures   Flu vaccine trivalent PF, 6mos and older(Flulaval,Afluria,Fluarix,Fluzone)   Lipid panel    Standing Status:   Future    Standing Expiration Date:   05/16/2024   TSH    Standing Status:   Future    Standing Expiration Date:   05/16/2024   Comprehensive metabolic panel    Standing Status:   Future    Standing Expiration Date:   05/16/2024   CBC with Differential/Platelet    Standing Status:   Future    Standing Expiration Date:   05/16/2024    Patient Instructions  Flu shot today  Return at your convenience for fasting labs.  We will refer you for dermatology eval Recent weakness episode likely related to viral infection, ?COVID.   Follow up plan: No follow-ups on file.  Eustaquio Boyden, MD

## 2023-05-17 NOTE — Assessment & Plan Note (Signed)
Skin lesion to right inguinal region present for 6 yrs, unchanging. Anticipate benign mole.  He would desire definitive treatment, aware may not be covered by insurance.  Refer to dermatology

## 2023-05-17 NOTE — Assessment & Plan Note (Addendum)
Has seen neurology.  Stable period with PRN imitrex/excedrin migraine abortively.

## 2023-05-17 NOTE — Assessment & Plan Note (Signed)
Isolated episode of extremity weakness/fatigue associated with sore throat.  Benign exam today.  ?COVID vs other viral induced myopathy, of short duration, symptoms now fully better. Update if recurrent or persistent symptoms for further evaluation.

## 2023-05-31 ENCOUNTER — Other Ambulatory Visit (INDEPENDENT_AMBULATORY_CARE_PROVIDER_SITE_OTHER): Payer: 59

## 2023-05-31 DIAGNOSIS — M6281 Muscle weakness (generalized): Secondary | ICD-10-CM | POA: Diagnosis not present

## 2023-05-31 DIAGNOSIS — Z131 Encounter for screening for diabetes mellitus: Secondary | ICD-10-CM

## 2023-05-31 DIAGNOSIS — Z13220 Encounter for screening for lipoid disorders: Secondary | ICD-10-CM | POA: Diagnosis not present

## 2023-05-31 DIAGNOSIS — Z136 Encounter for screening for cardiovascular disorders: Secondary | ICD-10-CM

## 2023-05-31 LAB — CBC WITH DIFFERENTIAL/PLATELET
Basophils Absolute: 0 10*3/uL (ref 0.0–0.1)
Basophils Relative: 0.5 % (ref 0.0–3.0)
Eosinophils Absolute: 0 10*3/uL (ref 0.0–0.7)
Eosinophils Relative: 1.1 % (ref 0.0–5.0)
HCT: 42.8 % (ref 39.0–52.0)
Hemoglobin: 14.6 g/dL (ref 13.0–17.0)
Lymphocytes Relative: 33.6 % (ref 12.0–46.0)
Lymphs Abs: 1.5 10*3/uL (ref 0.7–4.0)
MCHC: 34 g/dL (ref 30.0–36.0)
MCV: 86 fL (ref 78.0–100.0)
Monocytes Absolute: 0.3 10*3/uL (ref 0.1–1.0)
Monocytes Relative: 6 % (ref 3.0–12.0)
Neutro Abs: 2.6 10*3/uL (ref 1.4–7.7)
Neutrophils Relative %: 58.8 % (ref 43.0–77.0)
Platelets: 296 10*3/uL (ref 150.0–400.0)
RBC: 4.97 Mil/uL (ref 4.22–5.81)
RDW: 13 % (ref 11.5–15.5)
WBC: 4.4 10*3/uL (ref 4.0–10.5)

## 2023-05-31 LAB — COMPREHENSIVE METABOLIC PANEL
ALT: 24 U/L (ref 0–53)
AST: 22 U/L (ref 0–37)
Albumin: 4.5 g/dL (ref 3.5–5.2)
Alkaline Phosphatase: 75 U/L (ref 39–117)
BUN: 13 mg/dL (ref 6–23)
CO2: 29 meq/L (ref 19–32)
Calcium: 9.3 mg/dL (ref 8.4–10.5)
Chloride: 103 meq/L (ref 96–112)
Creatinine, Ser: 1.04 mg/dL (ref 0.40–1.50)
GFR: 94.18 mL/min (ref >60.00)
Glucose, Bld: 89 mg/dL (ref 70–99)
Potassium: 4.1 meq/L (ref 3.5–5.1)
Sodium: 138 meq/L (ref 135–145)
Total Bilirubin: 1.2 mg/dL (ref 0.2–1.2)
Total Protein: 6.7 g/dL (ref 6.0–8.3)

## 2023-05-31 LAB — LIPID PANEL
Cholesterol: 181 mg/dL (ref 0–200)
HDL: 55.6 mg/dL (ref >39.00)
LDL Cholesterol: 114 mg/dL — ABNORMAL HIGH (ref 0–99)
NonHDL: 125.26
Total CHOL/HDL Ratio: 3
Triglycerides: 57 mg/dL (ref 0.0–149.0)
VLDL: 11.4 mg/dL (ref 0.0–40.0)

## 2023-05-31 LAB — TSH: TSH: 1.25 u[IU]/mL (ref 0.35–5.50)

## 2024-05-20 ENCOUNTER — Ambulatory Visit (INDEPENDENT_AMBULATORY_CARE_PROVIDER_SITE_OTHER): Admitting: Family Medicine

## 2024-05-20 VITALS — BP 120/78 | HR 89 | Temp 98.6°F | Ht 66.25 in | Wt 162.0 lb

## 2024-05-20 DIAGNOSIS — M6281 Muscle weakness (generalized): Secondary | ICD-10-CM

## 2024-05-20 DIAGNOSIS — R5383 Other fatigue: Secondary | ICD-10-CM | POA: Diagnosis not present

## 2024-05-20 DIAGNOSIS — W57XXXS Bitten or stung by nonvenomous insect and other nonvenomous arthropods, sequela: Secondary | ICD-10-CM

## 2024-05-20 LAB — TESTOSTERONE: Testosterone: 429.72 ng/dL (ref 300.00–890.00)

## 2024-05-20 LAB — CBC WITH DIFFERENTIAL/PLATELET
Basophils Absolute: 0 K/uL (ref 0.0–0.1)
Basophils Relative: 0.4 % (ref 0.0–3.0)
Eosinophils Absolute: 0 K/uL (ref 0.0–0.7)
Eosinophils Relative: 0.4 % (ref 0.0–5.0)
HCT: 43 % (ref 39.0–52.0)
Hemoglobin: 14.9 g/dL (ref 13.0–17.0)
Lymphocytes Relative: 23 % (ref 12.0–46.0)
Lymphs Abs: 1.2 K/uL (ref 0.7–4.0)
MCHC: 34.6 g/dL (ref 30.0–36.0)
MCV: 85.5 fl (ref 78.0–100.0)
Monocytes Absolute: 0.3 K/uL (ref 0.1–1.0)
Monocytes Relative: 5.2 % (ref 3.0–12.0)
Neutro Abs: 3.6 K/uL (ref 1.4–7.7)
Neutrophils Relative %: 71 % (ref 43.0–77.0)
Platelets: 299 K/uL (ref 150.0–400.0)
RBC: 5.03 Mil/uL (ref 4.22–5.81)
RDW: 13.2 % (ref 11.5–15.5)
WBC: 5.1 K/uL (ref 4.0–10.5)

## 2024-05-20 LAB — COMPREHENSIVE METABOLIC PANEL WITH GFR
ALT: 19 U/L (ref 0–53)
AST: 16 U/L (ref 0–37)
Albumin: 4.8 g/dL (ref 3.5–5.2)
Alkaline Phosphatase: 68 U/L (ref 39–117)
BUN: 15 mg/dL (ref 6–23)
CO2: 32 meq/L (ref 19–32)
Calcium: 9.7 mg/dL (ref 8.4–10.5)
Chloride: 101 meq/L (ref 96–112)
Creatinine, Ser: 0.89 mg/dL (ref 0.40–1.50)
GFR: 111.64 mL/min (ref >60.00)
Glucose, Bld: 74 mg/dL (ref 70–99)
Potassium: 4 meq/L (ref 3.5–5.1)
Sodium: 139 meq/L (ref 135–145)
Total Bilirubin: 0.9 mg/dL (ref 0.2–1.2)
Total Protein: 7.1 g/dL (ref 6.0–8.3)

## 2024-05-20 LAB — SEDIMENTATION RATE: Sed Rate: >1 mm/h (ref 0–15)

## 2024-05-20 LAB — VITAMIN B12: Vitamin B-12: 405 pg/mL (ref 211–911)

## 2024-05-20 LAB — CK: Total CK: 165 U/L (ref 17–232)

## 2024-05-20 LAB — TSH: TSH: 1.18 u[IU]/mL (ref 0.35–5.50)

## 2024-05-20 LAB — MAGNESIUM: Magnesium: 1.8 mg/dL (ref 1.5–2.5)

## 2024-05-20 LAB — C-REACTIVE PROTEIN: CRP: <1 mg/dL (ref 0.5–20.0)

## 2024-05-20 NOTE — Patient Instructions (Addendum)
 VISIT SUMMARY: Today, you were seen for muscle weakness and fatigue that has been affecting you for a long time, worse for the past three weeks. We discussed your symptoms, including the muscle fatigue, sleep disturbances, and stress levels. We also reviewed your history of migraines and chronic low back pain.  YOUR PLAN: -GENERALIZED MUSCLE WEAKNESS AND FATIGUE: You have been experiencing muscle weakness and fatigue that is worse with physical activity. This could be due to several reasons, including low blood sugar, thyroid  issues, vitamin deficiencies, or Lyme disease. We will conduct blood tests to check for inflammation, muscle enzymes, thyroid  function, testosterone , and vitamin B12. We will also test for Lyme disease. If these tests do not provide answers, we may have you see a neurologist for further testing of your nerves and muscles.  -MIGRAINE WITH AURA (COMPLEX MIGRAINE): You have complex migraines that include symptoms like numbness and visual changes. These are currently being managed with Excedrin Migraine.   -CHRONIC LOW BACK PAIN: You have chronic low back pain that gets worse with prolonged standing. This may be related to a past back injury.   INSTRUCTIONS: We will be in touch with labwork from today. If these tests do not provide answers, we may refer you to a neurologist for further evaluation. We did a sleepiness questionnaire looking for possible sleep apnea, your score was in normal range.

## 2024-05-20 NOTE — Progress Notes (Addendum)
 Ph: [phone] Fax: [phone]   Patient ID: Derek Thornton, male    DOB: 1990/10/02, 33 y.o.   MRN: [id number]  This visit was conducted in person.  BP 120/78   Pulse 89   Temp 98.6 F (37 C) (Oral)   Ht 5' 6.25 (1.683 m)   Wt 162 lb (73.5 kg)   SpO2 99%   BMI 25.95 kg/m    Chief Complaint  Patient presents with   Medical Management of Chronic Issues    Pt here for Muscle Fatigue  Noticed 2 weeks ago     Subjective:   Discussed the use of AI scribe software for clinical note transcription with the patient, who gave verbal consent to proceed.  History of Present Illness   Derek Thornton is a 33 year old male who presents with muscle weakness and fatigue.  He experiences muscle fatigue and weakness similar to post-exercise soreness but without engaging in physical activity. This affects his entire body, especially the legs, and has been intermittent for years, with the latest episode lasting the past three weeks. Symptoms acutely worse last week after long day at work. Similar symptoms have previously been noted during respiratory infections.   If he does not eat within an hour of waking, his hands become jittery. There are no recent changes in appetite or weight, and he can consume large meals without difficulty.  He experiences sleep disturbances, waking multiple times a night but generally returning to sleep. He averages six and a half hours of sleep per night, aiming for eight. 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 He denies any depressed mood or thoughts of self-harm.  His current medications include Excedrin Migraine, fish oil, and a multivitamin. He uses betamethasone  cream for eczema on his feet.      Weakness worst in the mornings.  Non-restorative sleep.  No morning headaches.  Snores, no witnessed apnea, no PNdyspnea.  Back pain worse with prolonged standing ESS = 4      Relevant past medical, surgical, family and social history reviewed and updated as indicated. Interim medical history since our last visit reviewed. Allergies and medications reviewed and updated. Outpatient Medications Prior to Visit  Medication Sig Dispense Refill   aspirin-acetaminophen -caffeine  (EXCEDRIN MIGRAINE) 250-250-65 MG tablet Take by mouth every 6 (six) hours as needed for headache.     betamethasone  dipropionate 0.05 % cream Apply topically 2 (two) times daily as needed. 30 g 0   Multiple Vitamin (MULTIVITAMIN) tablet Take 1 tablet by mouth daily.     Omega-3 Fatty Acids (FISH OIL) 1000 MG CAPS Take 2 capsules by mouth daily.     SUMAtriptan (IMITREX) 50 MG tablet Take 50 mg by mouth every 2 (two) hours as needed for migraine. May repeat in 2 hours if headache persists or recurs. (Patient not taking: Reported on 10/22/2021)     No facility-administered medications prior to visit.     Per HPI unless specifically indicated in ROS section below Review of Systems  Objective:  BP 120/78   Pulse 89   Temp 98.6 F (37 C) (Oral)   Ht 5' 6.25 (1.683 m)   Wt 162 lb (73.5 kg)   SpO2 99%   BMI 25.95 kg/m   Wt Readings from Last 3 Encounters:  05/20/24 162 lb (73.5 kg)  05/17/23 163 lb (73.9 kg)  10/22/21 163 lb (73.9 kg)  Physical Exam Vitals and nursing note reviewed.  Constitutional:      Appearance: Normal appearance. He is not ill-appearing.  HENT:     Head: Normocephalic and atraumatic.     Mouth/Throat:     Mouth: Mucous membranes are moist.     Pharynx: Oropharynx is clear. No oropharyngeal exudate or posterior oropharyngeal erythema.  Eyes:     Extraocular Movements: Extraocular movements intact.     Conjunctiva/sclera: Conjunctivae normal.     Pupils: Pupils are equal, round, and reactive to light.  Neck:     Thyroid : No thyroid  mass or thyromegaly.  Cardiovascular:     Rate and Rhythm: Normal rate and regular rhythm.     Pulses: Normal  pulses.     Heart sounds: Normal heart sounds. No murmur heard. Pulmonary:     Effort: Pulmonary effort is normal. No respiratory distress.     Breath sounds: Normal breath sounds. No wheezing, rhonchi or rales.  Abdominal:     General: Bowel sounds are normal. There is no distension.     Palpations: Abdomen is soft. There is no mass.     Tenderness: There is no abdominal tenderness. There is no guarding or rebound.     Hernia: No hernia is present.  Musculoskeletal:     Cervical back: Normal range of motion and neck supple.     Right lower leg: No edema.     Left lower leg: No edema.     Comments:  No pain midline spine No significant paraspinous mm tenderness No pain with int/ext rotation at hip. No significant pain to palpation at SIJ, GTB or sciatic notch bilaterally.   Skin:    General: Skin is warm and dry.     Findings: No rash.  Neurological:     General: No focal deficit present.     Mental Status: He is alert. Mental status is at baseline.     Cranial Nerves: Cranial nerves 2-12 are intact.     Sensory: Sensation is intact.     Motor: Motor function is intact.     Coordination: Coordination is intact.     Gait: Gait is intact.     Comments:  CN 2-12 intact FTN intact EOMI No ataxia 5/5 strength BUE, BLE  Grip strength intact Sensation intact  Psychiatric:        Mood and Affect: Mood normal.        Behavior: Behavior normal.       Results   RADIOLOGY Brain MRI without contrast (2021): Normal Head CT (2020): Normal       Results for orders placed or performed in visit on 05/31/23  CBC with Differential/Platelet   Collection Time: 05/31/23  7:59 AM  Result Value Ref Range   WBC 4.4 4.0 - 10.5 K/uL   RBC 4.97 4.22 - 5.81 Mil/uL   Hemoglobin 14.6 13.0 - 17.0 g/dL   HCT 57.1 60.9 - 47.9 %   MCV 86.0 78.0 - 100.0 fl   MCHC 34.0 30.0 - 36.0 g/dL   RDW 86.9 88.4 - 84.4 %   Platelets 296.0 150.0 - 400.0 K/uL   Neutrophils Relative % 58.8 43.0 - 77.0 %    Lymphocytes Relative 33.6 12.0 - 46.0 %   Monocytes Relative 6.0 3.0 - 12.0 %   Eosinophils Relative 1.1 0.0 - 5.0 %   Basophils Relative 0.5 0.0 - 3.0 %   Neutro Abs 2.6 1.4 - 7.7 K/uL   Lymphs Abs 1.5 0.7 - 4.0  K/uL   Monocytes Absolute 0.3 0.1 - 1.0 K/uL   Eosinophils Absolute 0.0 0.0 - 0.7 K/uL   Basophils Absolute 0.0 0.0 - 0.1 K/uL  Comprehensive metabolic panel   Collection Time: 05/31/23  7:59 AM  Result Value Ref Range   Sodium 138 135 - 145 mEq/L   Potassium 4.1 3.5 - 5.1 mEq/L   Chloride 103 96 - 112 mEq/L   CO2 29 19 - 32 mEq/L   Glucose, Bld 89 70 - 99 mg/dL   BUN 13 6 - 23 mg/dL   Creatinine, Ser 8.95 0.40 - 1.50 mg/dL   Total Bilirubin 1.2 0.2 - 1.2 mg/dL   Alkaline Phosphatase 75 39 - 117 U/L   AST 22 0 - 37 U/L   ALT 24 0 - 53 U/L   Total Protein 6.7 6.0 - 8.3 g/dL   Albumin 4.5 3.5 - 5.2 g/dL   GFR 05.81 >39.99 mL/min   Calcium 9.3 8.4 - 10.5 mg/dL  TSH   Collection Time: 05/31/23  7:59 AM  Result Value Ref Range   TSH 1.25 0.35 - 5.50 uIU/mL  Lipid panel   Collection Time: 05/31/23  7:59 AM  Result Value Ref Range   Cholesterol 181 0 - 200 mg/dL   Triglycerides 42.9 0.0 - 149.0 mg/dL   HDL 44.39 >60.99 mg/dL   VLDL 88.5 0.0 - 59.9 mg/dL   LDL Cholesterol 885 (H) 0 - 99 mg/dL   Total CHOL/HDL Ratio 3    NonHDL 125.26     Assessment & Plan:      Generalized muscle weakness and fatigue Intermittent muscle weakness and fatigue off and on for years, again present for the past three weeks. Symptoms symmetrical, not linked to specific muscle groups. Differential includes hypoglycemia, thyroid  dysfunction, vitamin deficiencies, and Lyme disease. Previous neurological evaluation unremarkable. - Order blood tests: inflammation markers, creatine kinase, thyroid  function, testosterone , vitamin B12. - Order Lyme disease test in h/o many tick bites over the years due to line of work. - Consider neurology referral to consider nerve conduction study and  electromyogram if initial tests unremarkable.   Migraine with aura (complex migraine) Complex migraines with aura, including numbness and visual changes. Occasional occipital headaches managed with Excedrin Migraine.        Problem List Items Addressed This Visit     Fatigue   Relevant Orders   Comprehensive metabolic panel with GFR   TSH   CBC with Differential/Platelet   Vitamin B12   CK   Magnesium   Testosterone    Sedimentation rate   ANA   C-reactive protein   B. burgdorfi antibodies by WB   Muscle weakness - Primary   Relevant Orders   Comprehensive metabolic panel with GFR   TSH   CBC with Differential/Platelet   Vitamin B12   CK   Magnesium   Testosterone    Sedimentation rate   ANA   C-reactive protein   B. burgdorfi antibodies by WB   Other Visit Diagnoses       Tick bite, unspecified site, sequela       Relevant Orders   B. burgdorfi antibodies by WB        No orders of the defined types were placed in this encounter.   Orders Placed This Encounter  Procedures   Comprehensive metabolic panel with GFR   TSH   CBC with Differential/Platelet   Vitamin B12   CK   Magnesium   Testosterone    Sedimentation rate   ANA  C-reactive protein   B. burgdorfi antibodies by WB    Patient Instructions  VISIT SUMMARY: Today, you were seen for muscle weakness and fatigue that has been affecting you for a long time, worse for the past three weeks. We discussed your symptoms, including the muscle fatigue, sleep disturbances, and stress levels. We also reviewed your history of migraines and chronic low back pain.  YOUR PLAN: -GENERALIZED MUSCLE WEAKNESS AND FATIGUE: You have been experiencing muscle weakness and fatigue that is worse with physical activity. This could be due to several reasons, including low blood sugar, thyroid  issues, vitamin deficiencies, or Lyme disease. We will conduct blood tests to check for inflammation, muscle enzymes, thyroid   function, testosterone , and vitamin B12. We will also test for Lyme disease. If these tests do not provide answers, we may have you see a neurologist for further testing of your nerves and muscles.  -MIGRAINE WITH AURA (COMPLEX MIGRAINE): You have complex migraines that include symptoms like numbness and visual changes. These are currently being managed with Excedrin Migraine.   -CHRONIC LOW BACK PAIN: You have chronic low back pain that gets worse with prolonged standing. This may be related to a past back injury.   INSTRUCTIONS: We will be in touch with labwork from today. If these tests do not provide answers, we may refer you to a neurologist for further evaluation. We did a sleepiness questionnaire looking for possible sleep apnea, your score was in normal range.   Follow up plan: Return if symptoms worsen or fail to improve.  Anton Blas, MD

## 2024-05-22 LAB — ANTI-NUCLEAR AB-TITER (ANA TITER): ANA Titer 1: 1:320 {titer} — ABNORMAL HIGH

## 2024-05-22 LAB — ANA: Anti Nuclear Antibody (ANA): POSITIVE — AB

## 2024-05-23 LAB — B. BURGDORFI ANTIBODIES BY WB
B burgdorferi IgG Abs (IB): NEGATIVE
B burgdorferi IgM Abs (IB): NEGATIVE
Lyme Disease 18 kD IgG: NONREACTIVE
Lyme Disease 23 kD IgG: NONREACTIVE
Lyme Disease 23 kD IgM: NONREACTIVE
Lyme Disease 28 kD IgG: NONREACTIVE
Lyme Disease 30 kD IgG: NONREACTIVE
Lyme Disease 39 kD IgG: NONREACTIVE
Lyme Disease 39 kD IgM: REACTIVE — AB
Lyme Disease 41 kD IgG: NONREACTIVE
Lyme Disease 41 kD IgM: NONREACTIVE
Lyme Disease 45 kD IgG: NONREACTIVE
Lyme Disease 58 kD IgG: NONREACTIVE
Lyme Disease 66 kD IgG: NONREACTIVE
Lyme Disease 93 kD IgG: NONREACTIVE

## 2024-05-24 ENCOUNTER — Ambulatory Visit: Payer: Self-pay | Admitting: Family Medicine

## 2024-05-24 DIAGNOSIS — M6281 Muscle weakness (generalized): Secondary | ICD-10-CM

## 2024-05-24 DIAGNOSIS — R768 Other specified abnormal immunological findings in serum: Secondary | ICD-10-CM

## 2024-05-24 DIAGNOSIS — R5383 Other fatigue: Secondary | ICD-10-CM

## 2024-05-25 NOTE — Telephone Encounter (Signed)
 Rheum referral placed.

## 2024-07-04 ENCOUNTER — Encounter: Payer: Self-pay | Admitting: *Deleted
